# Patient Record
Sex: Female | Born: 1945 | Hispanic: No | Smoking: Never smoker
Health system: Southern US, Community
[De-identification: ages and names within clinical notes are randomized; demographics above are authoritative.]

## PROBLEM LIST (undated history)

## (undated) DIAGNOSIS — E119 Type 2 diabetes mellitus without complications: Secondary | ICD-10-CM

---

## 2006-03-18 DIAGNOSIS — R609 Edema, unspecified: Secondary | ICD-10-CM | POA: Insufficient documentation

## 2013-08-22 DIAGNOSIS — I2510 Atherosclerotic heart disease of native coronary artery without angina pectoris: Secondary | ICD-10-CM | POA: Insufficient documentation

## 2016-12-28 DIAGNOSIS — E1122 Type 2 diabetes mellitus with diabetic chronic kidney disease: Secondary | ICD-10-CM | POA: Insufficient documentation

## 2017-03-20 DIAGNOSIS — H524 Presbyopia: Secondary | ICD-10-CM | POA: Insufficient documentation

## 2017-11-29 DIAGNOSIS — I878 Other specified disorders of veins: Secondary | ICD-10-CM | POA: Insufficient documentation

## 2018-03-05 DIAGNOSIS — L03116 Cellulitis of left lower limb: Secondary | ICD-10-CM | POA: Insufficient documentation

## 2018-07-03 DIAGNOSIS — E103293 Type 1 diabetes mellitus with mild nonproliferative diabetic retinopathy without macular edema, bilateral: Secondary | ICD-10-CM | POA: Insufficient documentation

## 2019-07-02 ENCOUNTER — Ambulatory Visit: Payer: Medicare PPO | Admitting: Occupational Therapy

## 2019-07-07 ENCOUNTER — Encounter: Payer: Medicare PPO | Admitting: Occupational Therapy

## 2019-07-09 ENCOUNTER — Encounter: Payer: Medicare PPO | Admitting: Occupational Therapy

## 2019-07-14 ENCOUNTER — Encounter: Payer: Self-pay | Admitting: Emergency Medicine

## 2019-07-14 ENCOUNTER — Emergency Department: Payer: Medicare PPO

## 2019-07-14 ENCOUNTER — Telehealth: Payer: Self-pay | Admitting: Emergency Medicine

## 2019-07-14 ENCOUNTER — Other Ambulatory Visit: Payer: Self-pay

## 2019-07-14 ENCOUNTER — Inpatient Hospital Stay
Admission: EM | Admit: 2019-07-14 | Discharge: 2019-07-22 | DRG: 270 | Disposition: A | Discharge status: Skilled Nursing Facility | Payer: Medicare PPO | Attending: Internal Medicine | Admitting: Internal Medicine

## 2019-07-14 ENCOUNTER — Encounter: Payer: Medicare PPO | Admitting: Occupational Therapy

## 2019-07-14 DIAGNOSIS — I824Y2 Acute embolism and thrombosis of unspecified deep veins of left proximal lower extremity: Secondary | ICD-10-CM

## 2019-07-14 DIAGNOSIS — I129 Hypertensive chronic kidney disease with stage 1 through stage 4 chronic kidney disease, or unspecified chronic kidney disease: Secondary | ICD-10-CM | POA: Diagnosis present

## 2019-07-14 DIAGNOSIS — Z794 Long term (current) use of insulin: Secondary | ICD-10-CM

## 2019-07-14 DIAGNOSIS — Z933 Colostomy status: Secondary | ICD-10-CM

## 2019-07-14 DIAGNOSIS — E669 Obesity, unspecified: Secondary | ICD-10-CM | POA: Diagnosis present

## 2019-07-14 DIAGNOSIS — U071 COVID-19: Secondary | ICD-10-CM | POA: Diagnosis present

## 2019-07-14 DIAGNOSIS — I82431 Acute embolism and thrombosis of right popliteal vein: Secondary | ICD-10-CM | POA: Diagnosis present

## 2019-07-14 DIAGNOSIS — E10319 Type 1 diabetes mellitus with unspecified diabetic retinopathy without macular edema: Secondary | ICD-10-CM | POA: Diagnosis present

## 2019-07-14 DIAGNOSIS — M549 Dorsalgia, unspecified: Secondary | ICD-10-CM | POA: Diagnosis present

## 2019-07-14 DIAGNOSIS — I82461 Acute embolism and thrombosis of right calf muscular vein: Secondary | ICD-10-CM | POA: Diagnosis present

## 2019-07-14 DIAGNOSIS — Z6834 Body mass index (BMI) 34.0-34.9, adult: Secondary | ICD-10-CM

## 2019-07-14 DIAGNOSIS — Z932 Ileostomy status: Secondary | ICD-10-CM

## 2019-07-14 DIAGNOSIS — Z79899 Other long term (current) drug therapy: Secondary | ICD-10-CM

## 2019-07-14 DIAGNOSIS — E1022 Type 1 diabetes mellitus with diabetic chronic kidney disease: Secondary | ICD-10-CM | POA: Diagnosis present

## 2019-07-14 DIAGNOSIS — L98421 Non-pressure chronic ulcer of back limited to breakdown of skin: Secondary | ICD-10-CM | POA: Insufficient documentation

## 2019-07-14 DIAGNOSIS — E1042 Type 1 diabetes mellitus with diabetic polyneuropathy: Secondary | ICD-10-CM | POA: Diagnosis present

## 2019-07-14 DIAGNOSIS — Z833 Family history of diabetes mellitus: Secondary | ICD-10-CM

## 2019-07-14 DIAGNOSIS — D638 Anemia in other chronic diseases classified elsewhere: Secondary | ICD-10-CM | POA: Diagnosis present

## 2019-07-14 DIAGNOSIS — E875 Hyperkalemia: Secondary | ICD-10-CM | POA: Diagnosis not present

## 2019-07-14 DIAGNOSIS — I708 Atherosclerosis of other arteries: Secondary | ICD-10-CM | POA: Diagnosis present

## 2019-07-14 DIAGNOSIS — E1100 Type 2 diabetes mellitus with hyperosmolarity without nonketotic hyperglycemic-hyperosmolar coma (NKHHC): Secondary | ICD-10-CM | POA: Diagnosis not present

## 2019-07-14 DIAGNOSIS — I878 Other specified disorders of veins: Secondary | ICD-10-CM | POA: Diagnosis present

## 2019-07-14 DIAGNOSIS — N179 Acute kidney failure, unspecified: Secondary | ICD-10-CM | POA: Diagnosis present

## 2019-07-14 DIAGNOSIS — E860 Dehydration: Secondary | ICD-10-CM | POA: Diagnosis present

## 2019-07-14 DIAGNOSIS — L89320 Pressure ulcer of left buttock, unstageable: Secondary | ICD-10-CM | POA: Diagnosis present

## 2019-07-14 DIAGNOSIS — R531 Weakness: Secondary | ICD-10-CM

## 2019-07-14 DIAGNOSIS — H9190 Unspecified hearing loss, unspecified ear: Secondary | ICD-10-CM | POA: Diagnosis present

## 2019-07-14 DIAGNOSIS — Z85048 Personal history of other malignant neoplasm of rectum, rectosigmoid junction, and anus: Secondary | ICD-10-CM

## 2019-07-14 DIAGNOSIS — E8770 Fluid overload, unspecified: Secondary | ICD-10-CM | POA: Diagnosis not present

## 2019-07-14 DIAGNOSIS — Z8249 Family history of ischemic heart disease and other diseases of the circulatory system: Secondary | ICD-10-CM

## 2019-07-14 DIAGNOSIS — R739 Hyperglycemia, unspecified: Secondary | ICD-10-CM | POA: Diagnosis not present

## 2019-07-14 DIAGNOSIS — Z825 Family history of asthma and other chronic lower respiratory diseases: Secondary | ICD-10-CM

## 2019-07-14 DIAGNOSIS — I10 Essential (primary) hypertension: Secondary | ICD-10-CM | POA: Diagnosis present

## 2019-07-14 DIAGNOSIS — I82412 Acute embolism and thrombosis of left femoral vein: Secondary | ICD-10-CM | POA: Diagnosis present

## 2019-07-14 DIAGNOSIS — R627 Adult failure to thrive: Secondary | ICD-10-CM | POA: Diagnosis present

## 2019-07-14 DIAGNOSIS — E1165 Type 2 diabetes mellitus with hyperglycemia: Secondary | ICD-10-CM | POA: Diagnosis not present

## 2019-07-14 DIAGNOSIS — N184 Chronic kidney disease, stage 4 (severe): Secondary | ICD-10-CM | POA: Diagnosis present

## 2019-07-14 DIAGNOSIS — E876 Hypokalemia: Secondary | ICD-10-CM | POA: Diagnosis not present

## 2019-07-14 DIAGNOSIS — I82402 Acute embolism and thrombosis of unspecified deep veins of left lower extremity: Secondary | ICD-10-CM | POA: Diagnosis not present

## 2019-07-14 DIAGNOSIS — E1010 Type 1 diabetes mellitus with ketoacidosis without coma: Secondary | ICD-10-CM | POA: Diagnosis present

## 2019-07-14 DIAGNOSIS — L89150 Pressure ulcer of sacral region, unstageable: Secondary | ICD-10-CM | POA: Diagnosis present

## 2019-07-14 LAB — URINALYSIS, COMPLETE (UACMP) WITH MICROSCOPIC
Bacteria, UA: NONE SEEN
Bilirubin Urine: NEGATIVE
Glucose, UA: >=500 mg/dL — AB
Ketones, ur: NEGATIVE mg/dL
Nitrite: NEGATIVE
Protein, ur: NEGATIVE mg/dL
Specific Gravity, Urine: 1.012 (ref 1.005–1.030)
pH: 5 (ref 5.0–8.0)

## 2019-07-14 LAB — GLUCOSE, CAPILLARY
Glucose-Capillary: 274 mg/dL — ABNORMAL HIGH (ref 70–99)
Glucose-Capillary: 278 mg/dL — ABNORMAL HIGH (ref 70–99)
Glucose-Capillary: 311 mg/dL — ABNORMAL HIGH (ref 70–99)
Glucose-Capillary: 383 mg/dL — ABNORMAL HIGH (ref 70–99)
Glucose-Capillary: 555 mg/dL (ref 70–99)

## 2019-07-14 LAB — CBC WITH DIFFERENTIAL/PLATELET
Abs Immature Granulocytes: 0.19 10*3/uL — ABNORMAL HIGH (ref 0.00–0.07)
Basophils Absolute: 0.1 10*3/uL (ref 0.0–0.1)
Basophils Relative: 1 %
Eosinophils Absolute: 0 10*3/uL (ref 0.0–0.5)
Eosinophils Relative: 0 %
HCT: 44.6 % (ref 36.0–46.0)
Hemoglobin: 13.5 g/dL (ref 12.0–15.0)
Immature Granulocytes: 2 %
Lymphocytes Relative: 8 %
Lymphs Abs: 0.8 10*3/uL (ref 0.7–4.0)
MCH: 28.3 pg (ref 26.0–34.0)
MCHC: 30.3 g/dL (ref 30.0–36.0)
MCV: 93.5 fL (ref 80.0–100.0)
Monocytes Absolute: 0.6 10*3/uL (ref 0.1–1.0)
Monocytes Relative: 6 %
Neutro Abs: 8.1 10*3/uL — ABNORMAL HIGH (ref 1.7–7.7)
Neutrophils Relative %: 83 %
Platelets: 301 10*3/uL (ref 150–400)
RBC: 4.77 MIL/uL (ref 3.87–5.11)
RDW: 12.4 % (ref 11.5–15.5)
WBC: 9.7 10*3/uL (ref 4.0–10.5)
nRBC: 0 % (ref 0.0–0.2)

## 2019-07-14 LAB — PROTIME-INR
INR: 1.4 — ABNORMAL HIGH (ref 0.8–1.2)
Prothrombin Time: 17 seconds — ABNORMAL HIGH (ref 11.4–15.2)

## 2019-07-14 LAB — BLOOD GAS, VENOUS
Acid-base deficit: 12.4 mmol/L — ABNORMAL HIGH (ref 0.0–2.0)
Bicarbonate: 12.7 mmol/L — ABNORMAL LOW (ref 20.0–28.0)
O2 Saturation: 82.9 %
Patient temperature: 37
pCO2, Ven: 27 mmHg — ABNORMAL LOW (ref 44.0–60.0)
pH, Ven: 7.28 (ref 7.250–7.430)
pO2, Ven: 54 mmHg — ABNORMAL HIGH (ref 32.0–45.0)

## 2019-07-14 LAB — MRSA PCR SCREENING: MRSA by PCR: POSITIVE — AB

## 2019-07-14 LAB — COMPREHENSIVE METABOLIC PANEL
ALT: 13 U/L (ref 0–44)
AST: 20 U/L (ref 15–41)
Albumin: 2.8 g/dL — ABNORMAL LOW (ref 3.5–5.0)
Alkaline Phosphatase: 84 U/L (ref 38–126)
Anion gap: 18 — ABNORMAL HIGH (ref 5–15)
BUN: 95 mg/dL — ABNORMAL HIGH (ref 8–23)
CO2: 19 mmol/L — ABNORMAL LOW (ref 22–32)
Calcium: 8.9 mg/dL (ref 8.9–10.3)
Chloride: 92 mmol/L — ABNORMAL LOW (ref 98–111)
Creatinine, Ser: 3.54 mg/dL — ABNORMAL HIGH (ref 0.44–1.00)
GFR calc Af Amer: 14 mL/min — ABNORMAL LOW (ref >60)
GFR calc non Af Amer: 12 mL/min — ABNORMAL LOW (ref >60)
Glucose, Bld: 645 mg/dL (ref 70–99)
Potassium: 5 mmol/L (ref 3.5–5.1)
Sodium: 129 mmol/L — ABNORMAL LOW (ref 135–145)
Total Bilirubin: 1 mg/dL (ref 0.3–1.2)
Total Protein: 7.4 g/dL (ref 6.5–8.1)

## 2019-07-14 LAB — C-REACTIVE PROTEIN: CRP: 5.6 mg/dL — ABNORMAL HIGH (ref <1.0)

## 2019-07-14 LAB — TROPONIN I (HIGH SENSITIVITY)
Troponin I (High Sensitivity): 14 ng/L (ref <18)
Troponin I (High Sensitivity): 25 ng/L — ABNORMAL HIGH (ref <18)

## 2019-07-14 LAB — BASIC METABOLIC PANEL
Anion gap: 16 — ABNORMAL HIGH (ref 5–15)
BUN: 95 mg/dL — ABNORMAL HIGH (ref 8–23)
CO2: 21 mmol/L — ABNORMAL LOW (ref 22–32)
Calcium: 8.9 mg/dL (ref 8.9–10.3)
Chloride: 96 mmol/L — ABNORMAL LOW (ref 98–111)
Creatinine, Ser: 3.28 mg/dL — ABNORMAL HIGH (ref 0.44–1.00)
GFR calc Af Amer: 16 mL/min — ABNORMAL LOW (ref >60)
GFR calc non Af Amer: 13 mL/min — ABNORMAL LOW (ref >60)
Glucose, Bld: 351 mg/dL — ABNORMAL HIGH (ref 70–99)
Potassium: 3.9 mmol/L (ref 3.5–5.1)
Sodium: 133 mmol/L — ABNORMAL LOW (ref 135–145)

## 2019-07-14 LAB — APTT: aPTT: >160 seconds (ref 24–36)

## 2019-07-14 LAB — FERRITIN: Ferritin: 162 ng/mL (ref 11–307)

## 2019-07-14 LAB — FIBRIN DERIVATIVES D-DIMER (ARMC ONLY): Fibrin derivatives D-dimer (ARMC): >10000 ng/mL (FEU) — ABNORMAL HIGH (ref 0.00–499.00)

## 2019-07-14 LAB — BETA-HYDROXYBUTYRIC ACID: Beta-Hydroxybutyric Acid: 2.35 mmol/L — ABNORMAL HIGH (ref 0.05–0.27)

## 2019-07-14 LAB — PROCALCITONIN: Procalcitonin: 0.83 ng/mL

## 2019-07-14 MED ORDER — DEXTROSE-NACL 5-0.45 % IV SOLN: INTRAVENOUS | Status: DC

## 2019-07-14 MED ORDER — ONDANSETRON HCL 4 MG/2ML IJ SOLN: 4.0000 mg | Freq: Four times a day (QID) | INTRAMUSCULAR | Status: DC | PRN

## 2019-07-14 MED ORDER — ACETAMINOPHEN 650 MG RE SUPP: 650.0000 mg | Freq: Four times a day (QID) | RECTAL | Status: DC | PRN

## 2019-07-14 MED ORDER — MUPIROCIN 2 % EX OINT
1.0000 "application " | TOPICAL_OINTMENT | Freq: Two times a day (BID) | CUTANEOUS | Status: AC
  Administered 2019-07-14 – 2019-07-19 (×10): 1 via NASAL
  Filled 2019-07-14 (×2): qty 22

## 2019-07-14 MED ORDER — ADULT MULTIVITAMIN W/MINERALS CH
1.0000 | ORAL_TABLET | Freq: Every day | ORAL | Status: DC
  Administered 2019-07-15 – 2019-07-22 (×8): 1 via ORAL
  Filled 2019-07-14 (×8): qty 1

## 2019-07-14 MED ORDER — LACTATED RINGERS IV BOLUS
1000.0000 mL | Freq: Once | INTRAVENOUS | Status: AC
  Administered 2019-07-14: 1000 mL via INTRAVENOUS

## 2019-07-14 MED ORDER — CHLORHEXIDINE GLUCONATE CLOTH 2 % EX PADS
6.0000 | MEDICATED_PAD | Freq: Every day | CUTANEOUS | Status: AC
  Administered 2019-07-15 – 2019-07-19 (×3): 6 via TOPICAL

## 2019-07-14 MED ORDER — AMLODIPINE BESYLATE 5 MG PO TABS
2.5000 mg | ORAL_TABLET | Freq: Every day | ORAL | Status: DC
  Administered 2019-07-15 – 2019-07-21 (×7): 2.5 mg via ORAL
  Filled 2019-07-14 (×7): qty 1

## 2019-07-14 MED ORDER — INSULIN REGULAR(HUMAN) IN NACL 100-0.9 UT/100ML-% IV SOLN
INTRAVENOUS | Status: DC
  Administered 2019-07-15: 11 [IU]/h via INTRAVENOUS
  Filled 2019-07-14 (×2): qty 100

## 2019-07-14 MED ORDER — LACTATED RINGERS IV BOLUS
500.0000 mL | Freq: Once | INTRAVENOUS | Status: AC
  Administered 2019-07-14: 13:00:00 500 mL via INTRAVENOUS

## 2019-07-14 MED ORDER — ONDANSETRON HCL 4 MG PO TABS: 4.0000 mg | ORAL_TABLET | Freq: Four times a day (QID) | ORAL | Status: DC | PRN

## 2019-07-14 MED ORDER — PANTOPRAZOLE SODIUM 40 MG PO TBEC
40.0000 mg | DELAYED_RELEASE_TABLET | Freq: Every day | ORAL | Status: DC
  Administered 2019-07-15 – 2019-07-22 (×8): 40 mg via ORAL
  Filled 2019-07-14 (×8): qty 1

## 2019-07-14 MED ORDER — ATORVASTATIN CALCIUM 80 MG PO TABS
80.0000 mg | ORAL_TABLET | Freq: Every day | ORAL | Status: DC
  Administered 2019-07-15 – 2019-07-22 (×8): 80 mg via ORAL
  Filled 2019-07-14 (×4): qty 1
  Filled 2019-07-14 (×4): qty 4
  Filled 2019-07-14: qty 1
  Filled 2019-07-14: qty 4
  Filled 2019-07-14: qty 1
  Filled 2019-07-14: qty 4
  Filled 2019-07-14 (×2): qty 1
  Filled 2019-07-14: qty 4

## 2019-07-14 MED ORDER — HEPARIN (PORCINE) 25000 UT/250ML-% IV SOLN
950.0000 [IU]/h | INTRAVENOUS | Status: DC
  Administered 2019-07-14: 950 [IU]/h via INTRAVENOUS
  Filled 2019-07-14: qty 250

## 2019-07-14 MED ORDER — INSULIN ASPART 100 UNIT/ML ~~LOC~~ SOLN: 10.0000 [IU] | Freq: Once | SUBCUTANEOUS | Status: DC

## 2019-07-14 MED ORDER — VITAMIN D 25 MCG (1000 UNIT) PO TABS
5000.0000 [IU] | ORAL_TABLET | Freq: Every day | ORAL | Status: DC
  Administered 2019-07-15 – 2019-07-22 (×8): 5000 [IU] via ORAL
  Filled 2019-07-14 (×8): qty 5

## 2019-07-14 MED ORDER — SODIUM CHLORIDE 0.9 % IV SOLN: INTRAVENOUS | Status: DC

## 2019-07-14 MED ORDER — ACETAMINOPHEN 325 MG PO TABS
650.0000 mg | ORAL_TABLET | Freq: Four times a day (QID) | ORAL | Status: DC | PRN
  Administered 2019-07-18: 650 mg via ORAL
  Filled 2019-07-14: qty 2

## 2019-07-14 MED ORDER — INSULIN REGULAR(HUMAN) IN NACL 100-0.9 UT/100ML-% IV SOLN
INTRAVENOUS | Status: DC
  Administered 2019-07-14: 9.5 [IU]/h via INTRAVENOUS
  Filled 2019-07-14: qty 100

## 2019-07-14 MED ORDER — DEXTROSE 50 % IV SOLN
0.0000 mL | INTRAVENOUS | Status: DC | PRN
  Filled 2019-07-14: qty 50

## 2019-07-14 MED ORDER — VENLAFAXINE HCL ER 75 MG PO CP24
75.0000 mg | ORAL_CAPSULE | Freq: Every day | ORAL | Status: DC
  Administered 2019-07-15 – 2019-07-22 (×8): 75 mg via ORAL
  Filled 2019-07-14 (×8): qty 1

## 2019-07-14 MED ORDER — SODIUM CHLORIDE 0.9 % IV BOLUS: 1000.0000 mL | INTRAVENOUS | Status: AC

## 2019-07-14 MED ORDER — GABAPENTIN 300 MG PO CAPS
300.0000 mg | ORAL_CAPSULE | Freq: Three times a day (TID) | ORAL | Status: DC | PRN
  Administered 2019-07-17: 300 mg via ORAL
  Filled 2019-07-14: qty 1

## 2019-07-14 MED ORDER — HEPARIN BOLUS VIA INFUSION
4000.0000 [IU] | Freq: Once | INTRAVENOUS | Status: AC
  Administered 2019-07-14: 4000 [IU] via INTRAVENOUS
  Filled 2019-07-14: qty 4000

## 2019-07-14 MED ORDER — ASPIRIN EC 81 MG PO TBEC
81.0000 mg | DELAYED_RELEASE_TABLET | Freq: Every day | ORAL | Status: DC
  Administered 2019-07-15 – 2019-07-22 (×8): 81 mg via ORAL
  Filled 2019-07-14 (×8): qty 1

## 2019-07-14 MED ORDER — METOPROLOL SUCCINATE ER 25 MG PO TB24
25.0000 mg | ORAL_TABLET | Freq: Every day | ORAL | Status: DC
  Administered 2019-07-15 – 2019-07-22 (×8): 25 mg via ORAL
  Filled 2019-07-14 (×8): qty 1

## 2019-07-14 MED ORDER — SODIUM BICARBONATE 650 MG PO TABS
650.0000 mg | ORAL_TABLET | Freq: Two times a day (BID) | ORAL | Status: DC
  Administered 2019-07-14 – 2019-07-19 (×10): 650 mg via ORAL
  Filled 2019-07-14 (×12): qty 1

## 2019-07-14 NOTE — Consult Note (Signed)
ANTICOAGULATION CONSULT NOTE - Initial Consult  Pharmacy Consult for Heparin Drip Indication: VTE treatment  No Known Allergies  Patient Measurements: Height: 4\' 11"  (149.9 cm) Weight: 169 lb (76.7 kg) IBW/kg (Calculated) : 43.2 Heparin Dosing Weight: 60.8kg  Vital Signs: Temp: 97.7 F (36.5 C) (01/25 1213) Temp Source: Oral (01/25 1213) BP: 143/62 (01/25 1730) Pulse Rate: 106 (01/25 1730)  Labs: Recent Labs    07/14/19 1222 07/14/19 1529  HGB 13.5  --   HCT 44.6  --   PLT 301  --   CREATININE  --  3.54*  TROPONINIHS  --  14    Estimated Creatinine Clearance: 12.8 mL/min (A) (by C-G formula based on SCr of 3.54 mg/dL (H)).  Medications:  No pta anticoagulant of record  Assessment: Anne George is a 74 y.o. female with past medical history of diabetes, good fluid removal, colostomy, and recent history of COVID-19 infection presents to the ED complaining of generalized weakness.  Pharmacy has been consulted to initiate and monitor heparin drip for VTE treatment.   Goal of Therapy:  Heparin level 0.3-0.7 units/ml Monitor platelets by anticoagulation protocol: Yes   Plan:  Give 4000 units bolus x 1, followed by 950 units/hour.  Will check heparin level in 8 hours per protocol, and CBC's daily while on heparin drip.    Lu Duffel, PharmD, BCPS Clinical Pharmacist 07/14/2019 6:12 PM

## 2019-07-14 NOTE — ED Provider Notes (Signed)
Patient looks very weak with acute kidney injury and hyperglycemia.  Anion gap is elevated but I think this will improve just with fluids and I will give a dose of subcutaneous insulin.  I will also order a Doppler of the lower extremities.  I will discuss with the hospitalist for admission.   Earleen Newport, MD 07/14/19 [phone]

## 2019-07-14 NOTE — ED Triage Notes (Signed)
Pt presents from home via [facility] with c/o bilateral leg weakness that has progressively gotten worse over the last week. Pt covid positive on jan 13. Blood sugar 524, pt states she did not take insulin today. Pt given 254mL of fluid by ems in route. Pt currently alert and oriented x4.

## 2019-07-14 NOTE — H&P (Signed)
TRH H&P   Patient Demographics:    Anne George, is a 74 y.o. female  MRN: [id number]   DOB - 10/18/1945  Admit Date - 07/14/2019  Outpatient Primary MD for the patient is Hulen Skains, Edwinna Areola, MD  Referring MD ED  Outpatient Specialists: Outpatient cardiology and nephrology at [facility]  Patient coming from: Home  Chief Complaint  Patient presents with  . Weakness      HPI:    Anne George  is a 74 y.o. female, with history of type 1 diabetes mellitus on insulin with chronic kidney disease stage IV (baseline creatinine around 2.5), chronic lymphedema (being followed by outpatient wound care), hypertension, colostomy status, anemia of chronic disease, chronic venous stasis who was brought by EMS after patient called being unable to get out of bed and having difficulty ambulating for past few days.  She was seen by her PCP about 12 days back on 1/13 for fever and tested positive for COVID-19.  She reports at that time she had fever of 104 F and some dyspnea on exertion but no other symptoms.  She stayed at home and did not have any other symptoms and felt recovered.  However for the past 3-4 days she has been feeling increasingly weak having difficulty walking with pain in both her legs.  She also had very poor appetite and also was not hydrating herself well.  She also reports some polyuria.  She has been having some nonproductive cough for past 3 days as well.  She denies any shortness of breath, chills, fever, nausea, vomiting, chest pain, palpitations, abdominal pain, diarrhea, dysuria, fall, joint pains.  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  Denies any weight loss or sick contacts.  Patient reports taking Lantus 28 units at bedtime and sliding scale insulin premeal with every carb intake.  Given her poor appetite she was not taking premeal insulin.  Last night her blood glucose  was 194 but this morning it went up to 400.  EMS found that her home condition was very filthy and messy with several roaches.  In the ED she was tachycardic to low 100s, maintaining O2 sat on room air, normal blood pressure, respiratory rate and afebrile.  Blood work showed normal CBC.  Chemistry showed sodium of 129, chloride of 92, CO2 19 with a gap of 18, acute on chronic kidney injury with BUN of 95 and creatinine of 3.54, glucose of 645.  UA was negative for ketones.  Fibrin derivative D-dimer was >10,000.  Ferritin, procalcitonin and troponin were normal.  C-reactive protein pending. .  Patient given IV normal saline bolus, 10 units regular insulin and started on IV insulin drip.  Doppler lower extremity was done in the ED given her significantly high D-dimer with preliminary finding of occluded proximal popliteal and femoral veins.   Review of systems:    In addition  to the HPI above,  No Fever-chills, No Headache, No changes with Vision or hearing, No problems swallowing food or Liquids, No Chest pain, shortness of breath, cough + No Abdominal pain, No Nausea or Vommitting, Bowel movements are regular, No Blood in stool or Urine, No dysuria, polyuria + no polydipsia or polyphagia Generalized weakness with bilateral lower extremity pain, no tingling or numbness No new skin rashes or bruises, No new joints pains-aches,  No recent weight gain or loss, No significant Mental Stressors.   With Past History of the following :    Past medical history Hypertension Chronic kidney disease stage IV Diabetes mellitus type 1 with chronic kidney disease Chronic venous stasis Chronic lymphedema Obesity  Past surgical history Colostomy   Social History:     Social History   Tobacco Use  . Smoking status: Never Smoker  . Smokeless tobacco: Never Used  Substance Use Topics  . Alcohol use: Not on file     Lives -alone  Mobility -uses cane to ambulate at baseline      Family History :   Heart disease and diabetes in family   Home Medications:   Prior to Admission medications   Medication Sig Start Date End Date Taking? Authorizing Provider  amLODipine (NORVASC) 2.5 MG tablet Take 2.5 mg by mouth daily. 05/12/19 05/11/20 Yes [provider]  aspirin 81 MG EC tablet Take 81 mg by mouth daily. 02/02/16  Yes [provider]  atorvastatin (LIPITOR) 80 MG tablet Take 80 mg by mouth daily. 06/17/19  Yes [provider]  Cholecalciferol 125 MCG (5000 UT) TABS Take 5,000 Units by mouth daily. 08/16/16  Yes [provider]  furosemide (LASIX) 20 MG tablet Take 40 mg by mouth 2 (two) times daily. 06/18/19  Yes [provider]  gabapentin (NEURONTIN) 100 MG capsule Take 300 mg by mouth 3 (three) times daily as needed. 06/18/19  Yes [provider]  insulin aspart (NOVOLOG FLEXPEN) 100 UNIT/ML FlexPen INJECT BEFORE MEALS AND SNACKS 3 TO 5 TIMES DAILY AS NEEDED (2 UNITS FOR EVERY 15 GRAMS CARBS). PEN IN USE EXPIRES 28 DAYS 05/25/17  Yes [provider]  Insulin Glargine (LANTUS SOLOSTAR) 100 UNIT/ML Solostar Pen INJECT  28 UNITS SUBCUTANEOUSLY EVERY NIGHT 07/26/18  Yes [provider]  metoprolol succinate (TOPROL-XL) 25 MG 24 hr tablet Take 25 mg by mouth daily. 06/18/19  Yes [provider]  Multiple Vitamins-Minerals (COMPLETE) TABS Take 1 tablet by mouth daily. 07/19/07  Yes [provider]  omeprazole (PRILOSEC) 20 MG capsule Take 20 mg by mouth daily. 06/18/19  Yes [provider]  sodium bicarbonate 650 MG tablet Take 650 mg by mouth 2 (two) times daily. 04/24/19  Yes [provider]  venlafaxine XR (EFFEXOR-XR) 37.5 MG 24 hr capsule Take 2 capsules by mouth daily. 06/18/19  Yes [provider]     Allergies:    No Known Allergies   Physical Exam:   Vitals  Blood pressure (!) 129/53, pulse (!) 101, temperature 97.7 F (36.5 C), temperature source  Oral, resp. rate 16, height 4\' 11"  (1.499 m), weight 76.7 kg, SpO2 99 %.  Elderly obese female lying in bed appears fatigued, not in acute distress HEENT: Pupils reactive bilaterally, EOMI, no pallor, no icterus, dry oral mucosa, supple neck, no cervical lymphadenopathy Chest: Clear to auscultation bilateral, no added sounds CVS: Normal S1-S2, no murmurs rub or gallop GI: Soft, nondistended, nontender, colostomy with stool output, nonbloody, bowel sounds present Musculoskeletal: Bilateral lower  extreme lymphedema, tender to pressure over bilateral calves CNS: Alert and oriented, nonfocal   Data Review:    CBC Recent Labs  Lab 07/14/19 1222  WBC 9.7  HGB 13.5  HCT 44.6  PLT 301  MCV 93.5  MCH 28.3  MCHC 30.3  RDW 12.4  LYMPHSABS 0.8  MONOABS 0.6  EOSABS 0.0  BASOSABS 0.1   ------------------------------------------------------------------------------------------------------------------  Chemistries  Recent Labs  Lab 07/14/19 1529  NA 129*  K 5.0  CL 92*  CO2 19*  GLUCOSE 645*  BUN 95*  CREATININE 3.54*  CALCIUM 8.9  AST 20  ALT 13  ALKPHOS 84  BILITOT 1.0   ------------------------------------------------------------------------------------------------------------------ estimated creatinine clearance is 12.8 mL/min (A) (by C-G formula based on SCr of 3.54 mg/dL (H)). ------------------------------------------------------------------------------------------------------------------ No results for input(s): TSH, T4TOTAL, T3FREE, THYROIDAB in the last 72 hours.  Invalid input(s): FREET3  Coagulation profile No results for input(s): INR, PROTIME in the last 168 hours. ------------------------------------------------------------------------------------------------------------------- No results for input(s): DDIMER in the last 72 hours. -------------------------------------------------------------------------------------------------------------------  Cardiac  Enzymes No results for input(s): CKMB, TROPONINI, MYOGLOBIN in the last 168 hours.  Invalid input(s): CK ------------------------------------------------------------------------------------------------------------------ No results found for: BNP   ---------------------------------------------------------------------------------------------------------------  Urinalysis    Component Value Date/Time   COLORURINE YELLOW (A) 07/14/2019 1246   APPEARANCEUR HAZY (A) 07/14/2019 1246   LABSPEC 1.012 07/14/2019 1246   PHURINE 5.0 07/14/2019 1246   GLUCOSEU >=500 (A) 07/14/2019 1246   HGBUR MODERATE (A) 07/14/2019 1246   BILIRUBINUR NEGATIVE 07/14/2019 [place] 07/14/2019 1246   PROTEINUR NEGATIVE 07/14/2019 1246   NITRITE NEGATIVE 07/14/2019 1246   LEUKOCYTESUR TRACE (A) 07/14/2019 1246    ----------------------------------------------------------------------------------------------------------------   Imaging Results:    DG Chest Portable 1 View  Result Date: 07/14/2019 CLINICAL DATA:  Recent COVID-19 positive.  Hyperglycemia. EXAM: PORTABLE CHEST 1 VIEW COMPARISON:  None. FINDINGS: Lungs are clear. Heart size and pulmonary vascularity are normal. No adenopathy. There is aortic atherosclerosis. No bone lesions. IMPRESSION: Lungs clear. Cardiac silhouette normal. No adenopathy. Aortic Atherosclerosis (ICD10-I70.0). Electronically Signed   By: Lowella Grip III M.D.   On: 07/14/2019 13:17    My personal review of [id number]   Assessment & Plan:   Principal problem   Hyperosmolar non-ketotic state in patient with type 2 diabetes mellitus ([place]) Received 1.5 L Ringer's lactate bolus in the ED.  Have ordered another 1 L normal saline bolus followed by maintenance normal saline at 125 cc/h.  Start on insulin drip.  Monitor CBG every 4 hours until anion gap closed.  Check A1c. Likely triggered by recent illness, poor p.o. intake and missing her premeal insulin  dose. Diabetic coordinator consulted.      DVT, lower extremity, proximal, acute, left ([place]) Started on IV heparin drip.  Will follow on final Doppler results.  Could have been prothrombotic complication of recent [id number] infection.  No respiratory symptoms at present.  I will consider VQ scan if has significant clot burden or respiratory symptoms.    COVID-19 virus infection No current symptoms.  Did not require hospitalization or treatment with steroids or remdesivir.  Monitor on airborne and contact precautions.  Follow inflammatory markers    Acute renal failure superimpsed on stage 4 chronic kidney disease (HCC) Suspect prerenal with dehydration.  Strict I's/O.  Monitor with IV hydration.  Hold Lasix. Follows with outpatient nephrology.  Avoid nephrotoxins    Essential hypertension Continue amlodipine and metoprolol.    FTT (failure to thrive) in adult PT evaluation  Obesity (BMI 30.0-34.9)  Diabetes mellitus with peripheral neuropathy Continue Neurontin  Social issues TOC consult for addressing home conditions.   DVT Prophylaxis IV heparin  AM Labs Ordered, also please review Full Orders  Family Communication: Admission, patients condition and plan of care including tests being ordered have been discussed with the patient at bedside  Code Status full code  Likely DC to home versus SNF depending upon hospital course  Condition GUARDED   Consults called none  Admission status: Inpatient  Patient presenting with hyperosmolar nonketotic state with severe dehydration, acute on chronic kidney disease stage IV and acute DVT of left lower extremity.  Patient needs to be monitored in an inpatient setting in a stepdown unit for aggressive IV hydration, IV insulin, close monitoring of her kidney function which is likely to worsen further and IV anticoagulation for her acute DVT.  She is at high risk of developing acute PE and further hemodynamic compromise.  For this she  needs to be monitored in an inpatient setting for at least >2 midnight.  Time spent in minutes : 60   Kristoffer Bala M.D on 07/14/2019 at 5:38 PM  Between 7am to 7pm - Pager - [phone]. After 7pm go to [email or web address] - password [facility]  [facility] - Office  [phone]

## 2019-07-14 NOTE — ED Provider Notes (Signed)
[facility] Emergency Department Provider Note   ____________________________________________   First MD Initiated Contact with Patient 07/14/19 1205     (approximate)  I have reviewed the triage vital signs and the nursing notes.   HISTORY  Chief Complaint Weakness    HPI [place] is a 74 y.o. female with past medical history of diabetes, good fluid removal, and colostomy presents to the ED complaining of generalized weakness.  Patient reports that she initially had a fever and cough about 2 weeks ago and subsequently tested positive for COVID-19.  She states the fevers seem to have resolved and cough is improved with no shortness of breath.  However, she complains of significant generalized weakness.  She lives at home alone and has been unable to walk over the past couple of days, per EMS was found sitting on the couch and had not moved from that location in 2 days.  She denies any chest pain, shortness of breath, vomiting, abdominal pain, or diarrhea.  She does state that her appetite has been poor, but she has been trying to stay hydrated with fluids.  She denies any dysuria or hematuria.        History reviewed. No pertinent past medical history.  There are no problems to display for this patient.   History reviewed. No pertinent surgical history.  Prior to Admission medications   Not on File    Allergies Patient has no known allergies.  History reviewed. No pertinent family history.  Social History Social History   Tobacco Use  . Smoking status: Never Smoker  . Smokeless tobacco: Never Used  Substance Use Topics  . Alcohol use: Not on file  . Drug use: Not on file    Review of Systems  Constitutional: No fever/chills.  Positive for generalized weakness. Eyes: No visual changes. ENT: No sore throat. Cardiovascular: Denies chest pain. Respiratory: Denies shortness of breath. Gastrointestinal: No abdominal pain.  No  nausea, no vomiting.  No diarrhea.  No constipation. Genitourinary: Negative for dysuria. Musculoskeletal: Negative for back pain. Skin: Negative for rash. Neurological: Negative for headaches, focal weakness or numbness.  ____________________________________________   PHYSICAL EXAM:  VITAL SIGNS: ED Triage Vitals  Enc Vitals Group     BP      Pulse      Resp      Temp      Temp src      SpO2      Weight      Height      Head Circumference      Peak Flow      Pain Score      Pain Loc      Pain Edu?      Excl. in [place]?     Constitutional: Alert and oriented. Eyes: Conjunctivae are normal. Head: Atraumatic. Nose: No congestion/rhinnorhea. Mouth/Throat: Mucous membranes are moist. Neck: Normal ROM Cardiovascular: Normal rate, regular rhythm. Grossly normal heart sounds. Respiratory: Normal respiratory effort.  No retractions. Lungs CTAB. Gastrointestinal: Soft and nontender. No distention.  Colostomy bag in place. Genitourinary: deferred Musculoskeletal: No lower extremity tenderness, lymphedema noted to bilateral lower extremities. Neurologic:  Normal speech and language. No gross focal neurologic deficits are appreciated. Skin: Sacral decubitus ulceration noted. Psychiatric: Mood and affect are normal. Speech and behavior are normal.  ____________________________________________   LABS (all labs ordered are listed, but only abnormal results are displayed)  Labs Reviewed  CBC WITH DIFFERENTIAL/PLATELET - Abnormal; Notable for the following components:  Result Value   Neutro Abs 8.1 (*)    Abs Immature Granulocytes 0.19 (*)    All other components within normal limits  URINALYSIS, COMPLETE (UACMP) WITH MICROSCOPIC - Abnormal; Notable for the following components:   Color, Urine YELLOW (*)    APPearance HAZY (*)    Glucose, UA >=500 (*)    Hgb urine dipstick MODERATE (*)    Leukocytes,Ua TRACE (*)    All other components within normal limits  C-REACTIVE  PROTEIN  COMPREHENSIVE METABOLIC PANEL  FIBRIN DERIVATIVES D-DIMER ([facility] ONLY)  FERRITIN  PROCALCITONIN  TROPONIN I (HIGH SENSITIVITY)  TROPONIN I (HIGH SENSITIVITY)   ____________________________________________  EKG  ED ECG REPORT I, Blake Divine, the attending physician, personally viewed and interpreted this ECG.   Date: 07/14/2019  EKG Time: 12:19  Rate: 96  Rhythm: normal sinus rhythm  Axis: Normal  Intervals:none  ST&T Change: None   PROCEDURES  Procedure(s) performed (including Critical Care):  Procedures   ____________________________________________   INITIAL IMPRESSION / ASSESSMENT AND PLAN / ED COURSE       74 year old female presents to the ED with increasing weakness following recent COVID-19 diagnosis where she tested +12 days ago.  She is not in any respiratory distress and is maintaining O2 sats on room air.  However, it does appear that she is unable to care for herself with this weakness secondary to COVID-19.  Lab work shows hemoconcentration consistent with dehydration, patient hydrated with 500 cc IV fluid prior to arrival and will give an additional 500 cc IV fluid here in the ED.  She was noted to have a sacral decubitus ulcer but this does not appear acutely infected.  Will screen labs and chest x-ray, anticipate admission.      ____________________________________________   FINAL CLINICAL IMPRESSION(S) / ED DIAGNOSES  Final diagnoses:  [id number] virus infection  Generalized weakness  Skin ulcer of sacrum, limited to breakdown of skin [facility])     ED Discharge Orders    None       Note:  This document was prepared using Dragon voice recognition software and may include unintentional dictation errors.   Blake Divine, MD 07/14/19 [phone]

## 2019-07-15 DIAGNOSIS — I82402 Acute embolism and thrombosis of unspecified deep veins of left lower extremity: Secondary | ICD-10-CM

## 2019-07-15 DIAGNOSIS — E876 Hypokalemia: Secondary | ICD-10-CM

## 2019-07-15 DIAGNOSIS — E1165 Type 2 diabetes mellitus with hyperglycemia: Secondary | ICD-10-CM

## 2019-07-15 DIAGNOSIS — Z794 Long term (current) use of insulin: Secondary | ICD-10-CM

## 2019-07-15 LAB — BASIC METABOLIC PANEL
Anion gap: 13 (ref 5–15)
Anion gap: 11 (ref 5–15)
BUN: 92 mg/dL — ABNORMAL HIGH (ref 8–23)
BUN: 88 mg/dL — ABNORMAL HIGH (ref 8–23)
CO2: 23 mmol/L (ref 22–32)
CO2: 25 mmol/L (ref 22–32)
Calcium: 8.9 mg/dL (ref 8.9–10.3)
Calcium: 8.7 mg/dL — ABNORMAL LOW (ref 8.9–10.3)
Chloride: 99 mmol/L (ref 98–111)
Chloride: 97 mmol/L — ABNORMAL LOW (ref 98–111)
Creatinine, Ser: 2.91 mg/dL — ABNORMAL HIGH (ref 0.44–1.00)
Creatinine, Ser: 2.86 mg/dL — ABNORMAL HIGH (ref 0.44–1.00)
GFR calc Af Amer: 18 mL/min — ABNORMAL LOW (ref >60)
GFR calc Af Amer: 18 mL/min — ABNORMAL LOW (ref >60)
GFR calc non Af Amer: 15 mL/min — ABNORMAL LOW (ref >60)
GFR calc non Af Amer: 16 mL/min — ABNORMAL LOW (ref >60)
Glucose, Bld: 112 mg/dL — ABNORMAL HIGH (ref 70–99)
Glucose, Bld: 185 mg/dL — ABNORMAL HIGH (ref 70–99)
Potassium: 3.8 mmol/L (ref 3.5–5.1)
Potassium: 3.1 mmol/L — ABNORMAL LOW (ref 3.5–5.1)
Sodium: 135 mmol/L (ref 135–145)
Sodium: 133 mmol/L — ABNORMAL LOW (ref 135–145)

## 2019-07-15 LAB — HEMOGLOBIN A1C
Hgb A1c MFr Bld: 8.4 % — ABNORMAL HIGH (ref 4.8–5.6)
Mean Plasma Glucose: 194.38 mg/dL

## 2019-07-15 LAB — GLUCOSE, CAPILLARY
Glucose-Capillary: 117 mg/dL — ABNORMAL HIGH (ref 70–99)
Glucose-Capillary: 119 mg/dL — ABNORMAL HIGH (ref 70–99)
Glucose-Capillary: 129 mg/dL — ABNORMAL HIGH (ref 70–99)
Glucose-Capillary: 148 mg/dL — ABNORMAL HIGH (ref 70–99)
Glucose-Capillary: 209 mg/dL — ABNORMAL HIGH (ref 70–99)
Glucose-Capillary: 230 mg/dL — ABNORMAL HIGH (ref 70–99)
Glucose-Capillary: 268 mg/dL — ABNORMAL HIGH (ref 70–99)
Glucose-Capillary: 43 mg/dL — CL (ref 70–99)
Glucose-Capillary: 45 mg/dL — ABNORMAL LOW (ref 70–99)
Glucose-Capillary: 575 mg/dL (ref 70–99)
Glucose-Capillary: 94 mg/dL (ref 70–99)

## 2019-07-15 LAB — FIBRIN DERIVATIVES D-DIMER (ARMC ONLY): Fibrin derivatives D-dimer (ARMC): >7500 ng/mL (FEU) — ABNORMAL HIGH (ref 0.00–499.00)

## 2019-07-15 LAB — CBC
HCT: 38.9 % (ref 36.0–46.0)
Hemoglobin: 12.7 g/dL (ref 12.0–15.0)
MCH: 28.4 pg (ref 26.0–34.0)
MCHC: 32.6 g/dL (ref 30.0–36.0)
MCV: 87 fL (ref 80.0–100.0)
Platelets: 320 10*3/uL (ref 150–400)
RBC: 4.47 MIL/uL (ref 3.87–5.11)
RDW: 12.2 % (ref 11.5–15.5)
WBC: 14.7 10*3/uL — ABNORMAL HIGH (ref 4.0–10.5)
nRBC: 0 % (ref 0.0–0.2)

## 2019-07-15 LAB — OSMOLALITY: Osmolality: 320 mOsm/kg — ABNORMAL HIGH (ref 275–295)

## 2019-07-15 LAB — HEPARIN LEVEL (UNFRACTIONATED)
Heparin Unfractionated: 2.68 IU/mL — ABNORMAL HIGH (ref 0.30–0.70)
Heparin Unfractionated: 1.21 IU/mL — ABNORMAL HIGH (ref 0.30–0.70)
Heparin Unfractionated: <0.1 IU/mL — ABNORMAL LOW (ref 0.30–0.70)

## 2019-07-15 LAB — C-REACTIVE PROTEIN: CRP: 6.3 mg/dL — ABNORMAL HIGH (ref <1.0)

## 2019-07-15 MED ORDER — INSULIN ASPART 100 UNIT/ML ~~LOC~~ SOLN
0.0000 [IU] | Freq: Every day | SUBCUTANEOUS | Status: DC
  Administered 2019-07-16: 2 [IU] via SUBCUTANEOUS
  Administered 2019-07-17: 22:00:00 5 [IU] via SUBCUTANEOUS
  Administered 2019-07-20: 22:00:00 3 [IU] via SUBCUTANEOUS
  Filled 2019-07-15 (×5): qty 1

## 2019-07-15 MED ORDER — INSULIN ASPART 100 UNIT/ML ~~LOC~~ SOLN
5.0000 [IU] | Freq: Three times a day (TID) | SUBCUTANEOUS | Status: DC
  Administered 2019-07-16 – 2019-07-21 (×15): 5 [IU] via SUBCUTANEOUS
  Filled 2019-07-15 (×15): qty 1

## 2019-07-15 MED ORDER — INSULIN GLARGINE 100 UNIT/ML ~~LOC~~ SOLN
20.0000 [IU] | SUBCUTANEOUS | Status: DC
  Administered 2019-07-15: 20 [IU] via SUBCUTANEOUS
  Filled 2019-07-15: qty 0.2

## 2019-07-15 MED ORDER — INSULIN ASPART 100 UNIT/ML ~~LOC~~ SOLN: 3.0000 [IU] | Freq: Three times a day (TID) | SUBCUTANEOUS | Status: DC

## 2019-07-15 MED ORDER — SODIUM CHLORIDE 0.9 % IV SOLN
INTRAVENOUS | Status: DC
  Administered 2019-07-15: 100 mL/h via INTRAVENOUS

## 2019-07-15 MED ORDER — INSULIN DETEMIR 100 UNIT/ML ~~LOC~~ SOLN
22.0000 [IU] | Freq: Every day | SUBCUTANEOUS | Status: DC
  Administered 2019-07-16 – 2019-07-21 (×6): 22 [IU] via SUBCUTANEOUS
  Filled 2019-07-15: qty 0.22
  Filled 2019-07-15 (×6): qty 1

## 2019-07-15 MED ORDER — INSULIN ASPART 100 UNIT/ML ~~LOC~~ SOLN
0.0000 [IU] | Freq: Three times a day (TID) | SUBCUTANEOUS | Status: DC
  Administered 2019-07-15: 1 [IU] via SUBCUTANEOUS
  Administered 2019-07-16: 7 [IU] via SUBCUTANEOUS
  Administered 2019-07-16 (×2): 3 [IU] via SUBCUTANEOUS
  Administered 2019-07-17: 1 [IU] via SUBCUTANEOUS
  Administered 2019-07-17: 5 [IU] via SUBCUTANEOUS
  Administered 2019-07-17: 09:00:00 1 [IU] via SUBCUTANEOUS
  Administered 2019-07-18 (×2): 2 [IU] via SUBCUTANEOUS
  Administered 2019-07-18: 5 [IU] via SUBCUTANEOUS
  Administered 2019-07-19: 17:00:00 2 [IU] via SUBCUTANEOUS
  Administered 2019-07-19: 1 [IU] via SUBCUTANEOUS
  Administered 2019-07-19: 13:00:00 7 [IU] via SUBCUTANEOUS
  Administered 2019-07-20: 5 [IU] via SUBCUTANEOUS
  Administered 2019-07-20: 3 [IU] via SUBCUTANEOUS
  Administered 2019-07-20: 12:00:00 7 [IU] via SUBCUTANEOUS
  Administered 2019-07-21: 09:00:00 2 [IU] via SUBCUTANEOUS
  Administered 2019-07-21: 9 [IU] via SUBCUTANEOUS
  Filled 2019-07-15 (×18): qty 1

## 2019-07-15 MED ORDER — HEPARIN BOLUS VIA INFUSION
2000.0000 [IU] | Freq: Once | INTRAVENOUS | Status: AC
  Administered 2019-07-15: 2000 [IU] via INTRAVENOUS
  Filled 2019-07-15: qty 2000

## 2019-07-15 MED ORDER — HEPARIN (PORCINE) 25000 UT/250ML-% IV SOLN
750.0000 [IU]/h | INTRAVENOUS | Status: DC
  Administered 2019-07-15: 600 [IU]/h via INTRAVENOUS
  Filled 2019-07-15: qty 250

## 2019-07-15 MED ORDER — POTASSIUM CHLORIDE CRYS ER 20 MEQ PO TBCR
40.0000 meq | EXTENDED_RELEASE_TABLET | Freq: Once | ORAL | Status: AC
  Administered 2019-07-15: 11:00:00 40 meq via ORAL
  Filled 2019-07-15: qty 2

## 2019-07-15 MED ORDER — INSULIN GLARGINE 100 UNIT/ML ~~LOC~~ SOLN
22.0000 [IU] | SUBCUTANEOUS | Status: DC
  Filled 2019-07-15: qty 0.22

## 2019-07-15 MED ORDER — DEXTROSE 50 % IV SOLN
1.0000 | Freq: Once | INTRAVENOUS | Status: AC
  Administered 2019-07-15: 50 mL via INTRAVENOUS

## 2019-07-15 NOTE — Consult Note (Signed)
ANTICOAGULATION CONSULT NOTE - Initial Consult  Pharmacy Consult for Heparin Drip Indication: VTE treatment  No Known Allergies  Patient Measurements: Height: 4\' 11"  (149.9 cm) Weight: 169 lb (76.7 kg) IBW/kg (Calculated) : 43.2 Heparin Dosing Weight: 60.8kg  Vital Signs: Temp: 97.7 F (36.5 C) (01/26 1600) BP: 115/38 (01/26 1600) Pulse Rate: 79 (01/26 1600)  Labs: Recent Labs    07/14/19 1222 07/14/19 1529 07/14/19 1529 07/14/19 2133 07/15/19 0426 07/15/19 1004 07/15/19 1809  HGB 13.5  --   --   --  12.7  --   --   HCT 44.6  --   --   --  38.9  --   --   PLT 301  --   --   --  320  --   --   APTT  --   --   --  >160*  --   --   --   LABPROT  --   --   --  17.0*  --   --   --   INR  --   --   --  1.4*  --   --   --   HEPARINUNFRC  --   --   --   --  2.68* 1.21* <0.10*  CREATININE  --  3.54*   < > 3.28* 2.91* 2.86*  --   TROPONINIHS  --  14  --  25*  --   --   --    < > = values in this interval not displayed.    Estimated Creatinine Clearance: 15.9 mL/min (A) (by C-G formula based on SCr of 2.86 mg/dL (H)).  Medications:  No pta anticoagulant of record  Assessment: Anne George is a 74 y.o. female with past medical history of diabetes, good fluid removal, colostomy, and recent history of COVID-19 infection presents to the ED complaining of generalized weakness.  Pharmacy has been consulted to initiate and monitor heparin drip for VTE treatment.  1/26 0426 HL 2.68 Held for 2 hours and restart at a rate of 600 units/hr  1/26 1004 HL 1.21 No change  1/25 1809 HL < 0.1    Goal of Therapy:  Heparin level 0.3-0.7 units/ml Monitor platelets by anticoagulation protocol: Yes   Plan:  Heparin level subtherapeutic. Will give a 2000 units bolus and increae heparin gtt to 750 units/hr. Per RN heparin has not stopped and running fine. She changed out the lines to be safe. Recheck heparin level in 8 hours. CBC with AM labs.   Oswald Hillock, PharmD, BCPS Clinical  Pharmacist 07/15/2019 7:15 PM

## 2019-07-15 NOTE — Progress Notes (Signed)
Report called to Butch Penny on 1C.

## 2019-07-15 NOTE — Progress Notes (Signed)
PROGRESS NOTE                                                                                                                                                                                                             Patient Demographics:    Anne George, is a 74 y.o. female, DOB - Nov 26, 1945, [id number]  Admit date - 07/14/2019   Admitting Physician Louellen Molder, MD  Outpatient Primary MD for the patient is Hulen Skains, Edwinna Areola, MD  LOS - 1  Outpatient Specialists:  Chief Complaint  Patient presents with  . Weakness       Brief Narrative   74 year old female with type 1 diabetes mellitus, uncontrolled, chronic kidney disease stage IV (baseline creatinine around 2.5, follows with outpatient nephrology), chronic lymphedema/chronic venous stasis (being followed by outpatient wound care), hypertension, colostomy status, anemia of chronic disease, presented after having difficulty ambulating and bilateral leg pain.  She was tested positive for COVID-19 on 1/13 with associated fever (100 F) and some dyspnea but did not require hospitalization or any other treatment. Patient now having nonproductive cough for 3 days with significant weakness and difficulty ambulating. Patient did not take her premeal aspart as she was not eating well for past 3 days.  She was brought in by EMS who also noticed that she has very poor living conditions.  In the ED she was in hyperosmolar nonketotic state and blood work showed acute on chronic kidney disease and markedly elevated D-dimer (>10K).  Doppler lower extremities showing diffuse occlusion of left lower leg veins.   Subjective:   Patient reports some pain in her left leg.  Anion gap closed and off insulin drip.   Assessment  & Plan :   Principal problem   Hyperosmolar non-ketotic state in patient with type 2 diabetes mellitus (HCC) Anion gap closed and off insulin drip and IV  fluids.  Placed on Lantus 22 units at bedtime and premeal aspart. A1c of 8.4.  Diabetic coordinator following.  Active problems   DVT, lower extremity, proximal, acute, left (HCC) Occlusive DVT throughout left lower extremity (common femoral, profundus femoral, femoral, popliteal and visualized calf veins). Likely contributed by recent poor mobility and?  Prothrombotic event following COVID-19 infection. On IV heparin.  Vascular surgery consult appreciated and plans on left leg thrombectomy on 1/29, once renal function further improves.  Acute renal failure superimposed on stage 4 chronic kidney disease (HCC) Possibly prerenal with dehydration and poor p.o. intake.  Slowly improving in a.m. lab.  Monitor with IV fluids.  Holding Lasix.  Avoid nephrotoxins.  Hypokalemia Replenished    COVID-19 virus infection, recent No respiratory symptoms.  (Diagnosed on 1/13).  However does have elevated fibrin derivative D-dimer and CRP.  Monitor for now.     Essential hypertension Stable on amlodipine and metoprolol.    FTT (failure to thrive) in adult PT once able  Diabetic peripheral neuropathy Continue Neurontin    Obesity (BMI 30.0-34.9)  Social issues/poor home living condition [facility] consult   Stable for transfer to [place].  Code Status : Full code  Family Communication  : None (reports no close family)  Disposition Plan  : Pending vascular procedure on 1/29  Barriers For Discharge : Active symptoms, left lower leg DVT, uncontrolled diabetes  Consults  : Vascular surgery  Procedures  : Doppler lower extremity  DVT Prophylaxis  : IV heparin  Lab Results  Component Value Date   PLT 320 07/15/2019    Antibiotics  :    Anti-infectives (From admission, onward)   None        Objective:   Vitals:   07/15/19 0900 07/15/19 0912 07/15/19 1000 07/15/19 1100  BP: 135/77 (!) 146/78 (!) 134/56 (!) 123/53  Pulse: 90 90 82 77  Resp: 18  15 18   Temp:      TempSrc:       SpO2: 99%  95% 98%  Weight:      Height:        Wt Readings from Last 3 Encounters:  07/14/19 76.7 kg     Intake/Output Summary (Last 24 hours) at 07/15/2019 1204 Last data filed at 07/15/2019 0600 Gross per 24 hour  Intake 532.83 ml  Output 150 ml  Net 382.83 ml     Physical Exam  Gen: not in distress HEENT: , moist mucosa, supple neck Chest: clear b/l, no added sounds CVS: N S1&S2, no murmurs, GI: soft, nondistended, nontender, colostomy with clear output Musculoskeletal: warm, chronic left leg lymphedema, tender to pressure over bilateral calf and thighS   Data Review:    CBC Recent Labs  Lab 07/14/19 1222 07/15/19 0426  WBC 9.7 14.7*  HGB 13.5 12.7  HCT 44.6 38.9  PLT 301 320  MCV 93.5 87.0  MCH 28.3 28.4  MCHC 30.3 32.6  RDW 12.4 12.2  LYMPHSABS 0.8  --   MONOABS 0.6  --   EOSABS 0.0  --   BASOSABS 0.1  --     Chemistries  Recent Labs  Lab 07/14/19 1529 07/14/19 2133 07/15/19 0426 07/15/19 1004  NA 129* 133* 135 133*  K 5.0 3.9 3.8 3.1*  CL 92* 96* 99 97*  CO2 19* 21* 23 25  GLUCOSE 645* 351* 112* 185*  BUN 95* 95* 92* 88*  CREATININE 3.54* 3.28* 2.91* 2.86*  CALCIUM 8.9 8.9 8.9 8.7*  AST 20  --   --   --   ALT 13  --   --   --   ALKPHOS 84  --   --   --   BILITOT 1.0  --   --   --    ------------------------------------------------------------------------------------------------------------------ No results for input(s): CHOL, HDL, LDLCALC, TRIG, CHOLHDL, LDLDIRECT in the last 72 hours.  Lab Results  Component Value Date   HGBA1C 8.4 (H) 07/14/2019   ------------------------------------------------------------------------------------------------------------------ No results for input(s): TSH, T4TOTAL, T3FREE,  THYROIDAB in the last 72 hours.  Invalid input(s): FREET3 ------------------------------------------------------------------------------------------------------------------ Recent Labs    07/14/19 1529  FERRITIN 162     Coagulation profile Recent Labs  Lab 07/14/19 2133  INR 1.4*    No results for input(s): DDIMER in the last 72 hours.  Cardiac Enzymes No results for input(s): CKMB, TROPONINI, MYOGLOBIN in the last 168 hours.  Invalid input(s): CK ------------------------------------------------------------------------------------------------------------------ No results found for: BNP  Inpatient Medications  Scheduled Meds: . amLODipine  2.5 mg Oral Daily  . aspirin EC  81 mg Oral Daily  . atorvastatin  80 mg Oral Daily  . Chlorhexidine Gluconate Cloth  6 each Topical Q0600  . cholecalciferol  5,000 Units Oral Daily  . insulin aspart  0-5 Units Subcutaneous QHS  . insulin aspart  0-9 Units Subcutaneous TID WC  . insulin aspart  3 Units Subcutaneous TID WC  . insulin glargine  20 Units Subcutaneous Q24H  . metoprolol succinate  25 mg Oral Daily  . multivitamin with minerals  1 tablet Oral Daily  . mupirocin ointment  1 application Nasal BID  . pantoprazole  40 mg Oral Daily  . potassium chloride  40 mEq Oral Once  . sodium bicarbonate  650 mg Oral BID  . venlafaxine XR  75 mg Oral Daily   Continuous Infusions: . sodium chloride Stopped (07/15/19 0156)  . sodium chloride 75 mL/hr at 07/15/19 0647  . dextrose 5 % and 0.45% NaCl 75 mL/hr at 07/15/19 0600  . heparin 600 Units/hr (07/15/19 0907)  . insulin 1.1 mL/hr at 07/15/19 0600   PRN Meds:.acetaminophen **OR** acetaminophen, dextrose, gabapentin, ondansetron **OR** ondansetron (ZOFRAN) IV  Micro Results Recent Results (from the past 240 hour(s))  MRSA PCR Screening     Status: Abnormal   Collection Time: 07/14/19  6:57 PM   Specimen: Nasopharyngeal  Result Value Ref Range Status   MRSA by PCR POSITIVE (A) NEGATIVE Final    Comment:        The GeneXpert MRSA Assay (FDA approved for NASAL specimens only), is one component of a comprehensive MRSA colonization surveillance program. It is not intended to diagnose MRSA infection  nor to guide or monitor treatment for MRSA infections. RESULT CALLED TO, READ BACK BY AND VERIFIED WITH: RN TIFF @2040  07/14/19 AKT Performed at [facility] [address], [place], [place] [address]     Radiology Reports US Venous Img Lower Bilateral (DVT)  Result Date: 07/14/2019 CLINICAL DATA:  74 year old female with bilateral LOWER extremity pain for 3 months. EXAM: BILATERAL LOWER EXTREMITY VENOUS DOPPLER ULTRASOUND TECHNIQUE: Gray-scale sonography with compression, as well as color and duplex ultrasound, were performed to evaluate the deep venous system(s) from the level of the common femoral vein through the popliteal and proximal calf veins. COMPARISON:  None. FINDINGS: RIGHT: Normal compressibility of the common femoral, superficial femoral, and popliteal veins, as well as the visualized calf veins. Visualized portions of profunda femoral vein and great saphenous vein unremarkable. No filling defects to suggest DVT on grayscale or color Doppler imaging. Doppler waveforms show normal direction of venous flow, normal respiratory phasicity and response to augmentation. LEFT: Occlusive DVT is noted throughout the LEFT LOWER extremity including the common femoral, profundus femoral, femoral, popliteal and visualized calf veins. IMPRESSION: 1. Occlusive DVT throughout the visualized LEFT LOWER extremity. 2. No evidence of RIGHT LOWER extremity DVT. Critical Value/emergent results were called by telephone at the time of interpretation on 07/14/2019 at 5:59 pm to [facility] , who  verbally acknowledged these results. Electronically Signed   By: Margarette Canada M.D.   On: 07/14/2019 18:03   DG Chest Portable 1 View  Result Date: 07/14/2019 CLINICAL DATA:  Recent COVID-19 positive.  Hyperglycemia. EXAM: PORTABLE CHEST 1 VIEW COMPARISON:  None. FINDINGS: Lungs are clear. Heart size and pulmonary vascularity are normal. No adenopathy. There is aortic atherosclerosis. No bone lesions.  IMPRESSION: Lungs clear. Cardiac silhouette normal. No adenopathy. Aortic Atherosclerosis (ICD10-I70.0). Electronically Signed   By: Lowella Grip III M.D.   On: 07/14/2019 13:17    Time Spent in minutes 35   Cyndra Feinberg M.D on 07/15/2019 at 12:04 PM  Between 7am to 7pm - Pager - [phone]  After 7pm go to [email or web address] - password [facility]  [facility] -  Office  [phone]

## 2019-07-15 NOTE — Consult Note (Signed)
[facility] Vascular Consult Note  MRN : [id number]  Anne George is a 74 y.o. ([birth year]) female who presents with chief complaint of  Chief Complaint  Patient presents with  . Weakness   History of Present Illness:  The patient is a 74 year old female with multiple medical issues (see below) diagnosed with Covid approximately 2 weeks ago who presented to the emergency department complaining of "generalized weakness".  Approximately 2 weeks ago at the time of her Covid diagnosis the patient was experiencing fevers.  These have seemed to resolve and her cough is improved however she complains of significant generalized weakness.  She lives at home alone and has been unable to walk for the past couple days.  Per EMS was found sitting on the couch and has not moved from that location in 2 days.    Upon work-up in our emergency department the patient was found to be in acute on chronic renal failure.  Baseline kidney function stage IV.  Patient with some left lower extremity discomfort.  A Doppler was ordered and the patient was found to have:  Venous Duplex (07/14/19): 1. Occlusive DVT throughout the visualized LEFT LOWER extremity. 2. No evidence of RIGHT LOWER extremity DVT.  Vascular surgery was consulted by Dr. Clementeen Graham for further recommendations.   Current Facility-Administered Medications  Medication Dose Route Frequency Provider Last Rate Last Admin  . 0.9 %  sodium chloride infusion   Intravenous Continuous Dhungel, Nishant, MD 75 mL/hr at 07/15/19 0647 New Bag at 07/15/19 0647  . acetaminophen (TYLENOL) tablet 650 mg  650 mg Oral Q6H PRN Dhungel, Nishant, MD       Or  . acetaminophen (TYLENOL) suppository 650 mg  650 mg Rectal Q6H PRN Dhungel, Nishant, MD      . amLODipine (NORVASC) tablet 2.5 mg  2.5 mg Oral Daily Dhungel, Nishant, MD   2.5 mg at 07/15/19 0913  . aspirin EC tablet 81 mg  81 mg Oral Daily Dhungel, Nishant, MD   81 mg at 07/15/19 0915   . atorvastatin (LIPITOR) tablet 80 mg  80 mg Oral Daily Dhungel, Nishant, MD   80 mg at 07/15/19 0914  . Chlorhexidine Gluconate Cloth 2 % PADS 6 each  6 each Topical Q0600 Lang Snow, NP      . cholecalciferol (VITAMIN D3) tablet 5,000 Units  5,000 Units Oral Daily Dhungel, Nishant, MD   5,000 Units at 07/15/19 0915  . dextrose 50 % solution 0-50 mL  0-50 mL Intravenous PRN Dhungel, Nishant, MD      . gabapentin (NEURONTIN) capsule 300 mg  300 mg Oral TID PRN Dhungel, Nishant, MD      . heparin ADULT infusion 100 units/mL (25000 units/261mL sodium chloride 0.45%)  600 Units/hr Intravenous Continuous Dhungel, Nishant, MD 6 mL/hr at 07/15/19 0907 600 Units/hr at 07/15/19 0907  . insulin aspart (novoLOG) injection 0-5 Units  0-5 Units Subcutaneous QHS Ouma, Bing Neighbors, NP      . insulin aspart (novoLOG) injection 0-9 Units  0-9 Units Subcutaneous TID WC Ouma, Bing Neighbors, NP      . insulin aspart (novoLOG) injection 5 Units  5 Units Subcutaneous TID WC Dhungel, Nishant, MD      . Derrill Memo ON 07/16/2019] insulin glargine (LANTUS) injection 22 Units  22 Units Subcutaneous Q24H Dhungel, Nishant, MD      . insulin regular, human (MYXREDLIN) 100 units/ 100 mL infusion   Intravenous Continuous Dhungel, Nishant, MD 1.1 mL/hr at 07/15/19 0600  Rate Verify at 07/15/19 0600  . metoprolol succinate (TOPROL-XL) 24 hr tablet 25 mg  25 mg Oral Daily Dhungel, Nishant, MD   25 mg at 07/15/19 0912  . multivitamin with minerals tablet 1 tablet  1 tablet Oral Daily Dhungel, Nishant, MD   1 tablet at 07/15/19 0917  . mupirocin ointment (BACTROBAN) 2 % 1 application  1 application Nasal BID Lang Snow, NP   1 application at [id number] [phone]  . ondansetron (ZOFRAN) tablet 4 mg  4 mg Oral Q6H PRN Dhungel, Nishant, MD       Or  . ondansetron (ZOFRAN) injection 4 mg  4 mg Intravenous Q6H PRN Dhungel, Nishant, MD      . pantoprazole (PROTONIX) EC tablet 40 mg  40 mg Oral Daily Dhungel,  Nishant, MD   40 mg at 07/15/19 0913  . potassium chloride SA (KLOR-CON) CR tablet 40 mEq  40 mEq Oral Once Dhungel, Nishant, MD      . sodium bicarbonate tablet 650 mg  650 mg Oral BID Dhungel, Nishant, MD   650 mg at 07/15/19 0918  . venlafaxine XR (EFFEXOR-XR) 24 hr capsule 75 mg  75 mg Oral Daily Dhungel, Nishant, MD   75 mg at 07/15/19 [id number]   History reviewed. No pertinent past medical history.  History reviewed. No pertinent surgical history.  Social History Social History   Tobacco Use  . Smoking status: Never Smoker  . Smokeless tobacco: Never Used  Substance Use Topics  . Alcohol use: Not on file  . Drug use: Not on file   Family History History reviewed. No pertinent family history.  Denies family history of peripheral artery disease venous disease or renal disease.  No Known Allergies  REVIEW OF SYSTEMS (Negative unless checked)  Constitutional: [] Weight loss  [] Fever  [] Chills Cardiac: [] Chest pain   [] Chest pressure   [] Palpitations   [] Shortness of breath when laying flat   [] Shortness of breath at rest   [] Shortness of breath with exertion. Vascular:  [] Pain in legs with walking   [] Pain in legs at rest   [] Pain in legs when laying flat   [] Claudication   [] Pain in feet when walking  [] Pain in feet at rest  [] Pain in feet when laying flat   [] History of DVT   [] Phlebitis   [x] Swelling in legs   [] Varicose veins   [] Non-healing ulcers Pulmonary:   [] Uses home oxygen   [] Productive cough   [] Hemoptysis   [] Wheeze  [] COPD   [] Asthma Neurologic:  [] Dizziness  [] Blackouts   [] Seizures   [] History of stroke   [] History of TIA  [] Aphasia   [] Temporary blindness   [] Dysphagia   [] Weakness or numbness in arms   [] Weakness or numbness in legs Musculoskeletal:  [] Arthritis   [] Joint swelling   [] Joint pain   [] Low back pain Hematologic:  [] Easy bruising  [] Easy bleeding   [] Hypercoagulable state   [] Anemic  [] Hepatitis Gastrointestinal:  [] Blood in stool   [] Vomiting blood   [] Gastroesophageal reflux/heartburn   [] Difficulty swallowing. Genitourinary:  [x] Chronic kidney disease   [] Difficult urination  [] Frequent urination  [] Burning with urination   [] Blood in urine Skin:  [] Rashes   [x] Ulcers   [x] Wounds Psychological:  [] History of anxiety   []  History of major depression.  Physical Examination  Vitals:   07/15/19 0900 07/15/19 0912 07/15/19 1000 07/15/19 1100  BP: 135/77 (!) 146/78 (!) 134/56 (!) 123/53  Pulse: 90 90 82 77  Resp: 18  15 18   Temp:  TempSrc:      SpO2: 99%  95% 98%  Weight:      Height:       Body mass index is 34.13 kg/m. Gen:  WD/WN, NAD Head: [place]/AT, No temporalis wasting. Prominent temp pulse not noted. Ear/Nose/Throat: Hearing grossly intact, nares w/o erythema or drainage, oropharynx w/o Erythema/Exudate Eyes: Sclera non-icteric, conjunctiva clear Neck: Trachea midline.  No JVD.  Pulmonary:  Good air movement, respirations not labored, equal bilaterally.  Cardiac: RRR, normal S1, S2. Vascular:  Vessel Right Left  Radial Palpable Palpable  Ulnar Palpable Palpable  Brachial Palpable Palpable  Carotid Palpable, without bruit Palpable, without bruit  Aorta Not palpable N/A  Femoral Palpable Palpable  Popliteal Palpable Palpable  PT Non-Palpable Non-Palpable  DP Non-Palpable Non-Palpable   Left Lower Extremity: Thigh soft.  Calf soft.  There is no acute vascular compromise on exam.  Hard to palpate pedal pulses due to moderate edema however the foot is warm and there is good capillary refill.  No pain with palpation.  No pain with dorsiflexion.  Gastrointestinal: soft, non-tender/non-distended. No guarding/reflex.  Musculoskeletal: M/S 5/5 throughout.  Extremities without ischemic changes.  No deformity or atrophy. No edema. Neurologic: Sensation grossly intact in extremities.  Symmetrical.  Speech is fluent. Motor exam as listed above. Psychiatric: Judgment intact, Mood & affect appropriate for pt's clinical  situation. Dermatologic: Sacral Decub Lymph : No Cervical, Axillary, or Inguinal lymphadenopathy.  CBC Lab Results  Component Value Date   WBC 14.7 (H) 07/15/2019   HGB 12.7 07/15/2019   HCT 38.9 07/15/2019   MCV 87.0 07/15/2019   PLT 320 07/15/2019   BMET    Component Value Date/Time   NA 133 (L) 07/15/2019 1004   K 3.1 (L) 07/15/2019 1004   CL 97 (L) 07/15/2019 1004   CO2 25 07/15/2019 1004   GLUCOSE 185 (H) 07/15/2019 1004   BUN 88 (H) 07/15/2019 1004   CREATININE 2.86 (H) 07/15/2019 1004   CALCIUM 8.7 (L) 07/15/2019 1004   GFRNONAA 16 (L) 07/15/2019 1004   GFRAA 18 (L) 07/15/2019 1004   Estimated Creatinine Clearance: 15.9 mL/min (A) (by C-G formula based on SCr of 2.86 mg/dL (H)).  COAG Lab Results  Component Value Date   INR 1.4 (H) 07/14/2019   Radiology US Venous Img Lower Bilateral (DVT)  Result Date: 07/14/2019 CLINICAL DATA:  74 year old female with bilateral LOWER extremity pain for 3 months. EXAM: BILATERAL LOWER EXTREMITY VENOUS DOPPLER ULTRASOUND TECHNIQUE: Gray-scale sonography with compression, as well as color and duplex ultrasound, were performed to evaluate the deep venous system(s) from the level of the common femoral vein through the popliteal and proximal calf veins. COMPARISON:  None. FINDINGS: RIGHT: Normal compressibility of the common femoral, superficial femoral, and popliteal veins, as well as the visualized calf veins. Visualized portions of profunda femoral vein and great saphenous vein unremarkable. No filling defects to suggest DVT on grayscale or color Doppler imaging. Doppler waveforms show normal direction of venous flow, normal respiratory phasicity and response to augmentation. LEFT: Occlusive DVT is noted throughout the LEFT LOWER extremity including the common femoral, profundus femoral, femoral, popliteal and visualized calf veins. IMPRESSION: 1. Occlusive DVT throughout the visualized LEFT LOWER extremity. 2. No evidence of RIGHT LOWER  extremity DVT. Critical Value/emergent results were called by telephone at the time of interpretation on 07/14/2019 at 5:59 pm to [facility] , who verbally acknowledged these results. Electronically Signed   By: Margarette Canada M.D.   On: 07/14/2019 18:03  DG Chest Portable 1 View  Result Date: 07/14/2019 CLINICAL DATA:  Recent COVID-19 positive.  Hyperglycemia. EXAM: PORTABLE CHEST 1 VIEW COMPARISON:  None. FINDINGS: Lungs are clear. Heart size and pulmonary vascularity are normal. No adenopathy. There is aortic atherosclerosis. No bone lesions. IMPRESSION: Lungs clear. Cardiac silhouette normal. No adenopathy. Aortic Atherosclerosis (ICD10-I70.0). Electronically Signed   By: Lowella Grip III M.D.   On: 07/14/2019 13:17   Assessment/Plan The patient is a 74 year old female with multiple medical issues (see below) diagnosed with Covid approximately 2 weeks ago who presented to the emergency department complaining of "generalized weakness".  1. Left Lower Extremity DVT: Patient presents with an extensive occlusive left lower extremity DVT.  Patient would benefit from a venous thrombectomy/thrombolysis and attempt to lessen the clot burden, improve symptoms, decrease chance of pulmonary embolus as well as post phlebitic changes to the extremity.  In the setting of acute on chronic kidney disease would recommend waiting a few days and attempt to allow an improvement in kidney function.  Every effort to use minimal contrast will be given.  Procedure, risks and benefits explained to the patient.  All questions answered.  The patient wishes to proceed.  We will plan on this on Friday.  Continue heparin at this time.  2. Covid 19 Infection:  Symptoms have been improving.  Primary team is following.  3.  Chronic kidney disease stage IV Patient with acute on chronic kidney disease on admission.  Will hold off a couple of days to see if any improvement kidney function.  If there is some  improvement, will move forward with procedure minimizing the use of contrast.  Discussed with Dr. Mayme Genta, PA-C  07/15/2019 1:22 PM    This note was created with Dragon medical transcription system.  Any error is purely unintentional

## 2019-07-15 NOTE — Progress Notes (Addendum)
Inpatient Diabetes Program Recommendations  AACE/ADA: New Consensus Statement on Inpatient Glycemic Control (2015)  Target Ranges:  Prepandial:   less than 140 mg/dL      Peak postprandial:   less than 180 mg/dL (1-2 hours)      Critically ill patients:  140 - 180 mg/dL   Lab Results  Component Value Date   GLUCAP 209 (H) 07/15/2019   HGBA1C 8.4 (H) 07/14/2019    Review of Glycemic Control Results for Anne George, Anne George (MRN [id number]) as of 07/15/2019 09:51  Ref. Range 07/15/2019 04:01 07/15/2019 05:10 07/15/2019 08:23 07/15/2019 08:26 07/15/2019 08:50  Glucose-Capillary Latest Ref Range: 70 - 99 mg/dL 117 (H) 119 (H) 43 (LL) 45 (L) 209 (H)   Diabetes history: DM1 Outpatient Diabetes medications: Lantus 28 units qd + Novolog 2 units per 15 gms carbohydrate 3-5 x daily meal coverage Current orders for Inpatient glycemic control: Lantus 20 units qd + Novolog 3 units tid meal coverage if eats 50% + Novolog sensitive correction tid + hs 0-5 units  Inpatient Diabetes Program Recommendations:   -Increase Lantus to 22 units -Hold Novolog meal coverage until patient is eating Will follow during hospitalization.  Thank you, Nani Gasser. Twinkle Sockwell, RN, MSN, CDE  Diabetes Coordinator Inpatient Glycemic Control Team Team Pager [phone] (8am-5pm) 07/15/2019 9:57 AM

## 2019-07-15 NOTE — Consult Note (Signed)
ANTICOAGULATION CONSULT NOTE - Initial Consult  Pharmacy Consult for Heparin Drip Indication: VTE treatment  No Known Allergies  Patient Measurements: Height: 4\' 11"  (149.9 cm) Weight: 169 lb (76.7 kg) IBW/kg (Calculated) : 43.2 Heparin Dosing Weight: 60.8kg  Vital Signs: Temp: 97.6 F (36.4 C) (01/26 0400) Temp Source: Axillary (01/26 0400) BP: 113/80 (01/26 0600) Pulse Rate: 79 (01/26 0600)  Labs: Recent Labs    07/14/19 1222 07/14/19 1529 07/14/19 2133 07/15/19 0426  HGB 13.5  --   --  12.7  HCT 44.6  --   --  38.9  PLT 301  --   --  320  APTT  --   --  >160*  --   LABPROT  --   --  17.0*  --   INR  --   --  1.4*  --   HEPARINUNFRC  --   --   --  2.68*  CREATININE  --  3.54* 3.28* 2.91*  TROPONINIHS  --  14 25*  --     Estimated Creatinine Clearance: 15.6 mL/min (A) (by C-G formula based on SCr of 2.91 mg/dL (H)).  Medications:  No pta anticoagulant of record  Assessment: Anne George is a 74 y.o. female with past medical history of diabetes, good fluid removal, colostomy, and recent history of COVID-19 infection presents to the ED complaining of generalized weakness.  Pharmacy has been consulted to initiate and monitor heparin drip for VTE treatment.   Goal of Therapy:  Heparin level 0.3-0.7 units/ml Monitor platelets by anticoagulation protocol: Yes   Plan:  0126 @ 0430 HL 2.68 supratherapeutic. Will hold drip until 0844 and restart at a rate of 600 units/hr and will recheck HL at 1700, CBC trended down slightly, will continue to monitor.  Tobie Lords, PharmD, BCPS Clinical Pharmacist 07/15/2019 6:46 AM

## 2019-07-16 ENCOUNTER — Encounter: Payer: Medicare PPO | Admitting: Occupational Therapy

## 2019-07-16 LAB — GLUCOSE, CAPILLARY
Glucose-Capillary: 225 mg/dL — ABNORMAL HIGH (ref 70–99)
Glucose-Capillary: 324 mg/dL — ABNORMAL HIGH (ref 70–99)
Glucose-Capillary: 207 mg/dL — ABNORMAL HIGH (ref 70–99)
Glucose-Capillary: 224 mg/dL — ABNORMAL HIGH (ref 70–99)

## 2019-07-16 LAB — BASIC METABOLIC PANEL
Anion gap: 9 (ref 5–15)
BUN: 75 mg/dL — ABNORMAL HIGH (ref 8–23)
CO2: 21 mmol/L — ABNORMAL LOW (ref 22–32)
Calcium: 8.3 mg/dL — ABNORMAL LOW (ref 8.9–10.3)
Chloride: 106 mmol/L (ref 98–111)
Creatinine, Ser: 2.38 mg/dL — ABNORMAL HIGH (ref 0.44–1.00)
GFR calc Af Amer: 23 mL/min — ABNORMAL LOW (ref >60)
GFR calc non Af Amer: 20 mL/min — ABNORMAL LOW (ref >60)
Glucose, Bld: 218 mg/dL — ABNORMAL HIGH (ref 70–99)
Potassium: 4 mmol/L (ref 3.5–5.1)
Sodium: 136 mmol/L (ref 135–145)

## 2019-07-16 LAB — CBC
HCT: 36.6 % (ref 36.0–46.0)
Hemoglobin: 11.8 g/dL — ABNORMAL LOW (ref 12.0–15.0)
MCH: 28.6 pg (ref 26.0–34.0)
MCHC: 32.2 g/dL (ref 30.0–36.0)
MCV: 88.8 fL (ref 80.0–100.0)
Platelets: 288 10*3/uL (ref 150–400)
RBC: 4.12 MIL/uL (ref 3.87–5.11)
RDW: 12.5 % (ref 11.5–15.5)
WBC: 17.2 10*3/uL — ABNORMAL HIGH (ref 4.0–10.5)
nRBC: 0 % (ref 0.0–0.2)

## 2019-07-16 LAB — C-REACTIVE PROTEIN: CRP: 7.2 mg/dL — ABNORMAL HIGH (ref <1.0)

## 2019-07-16 LAB — HEPARIN LEVEL (UNFRACTIONATED)
Heparin Unfractionated: 1.37 IU/mL — ABNORMAL HIGH (ref 0.30–0.70)
Heparin Unfractionated: 0.78 IU/mL — ABNORMAL HIGH (ref 0.30–0.70)
Heparin Unfractionated: 0.34 IU/mL (ref 0.30–0.70)

## 2019-07-16 LAB — FIBRIN DERIVATIVES D-DIMER (ARMC ONLY): Fibrin derivatives D-dimer (ARMC): >7500 ng/mL (FEU) — ABNORMAL HIGH (ref 0.00–499.00)

## 2019-07-16 MED ORDER — HEPARIN (PORCINE) 25000 UT/250ML-% IV SOLN
600.0000 [IU]/h | INTRAVENOUS | Status: DC
  Administered 2019-07-17: 600 [IU]/h via INTRAVENOUS
  Filled 2019-07-16: qty 250

## 2019-07-16 NOTE — Progress Notes (Signed)
[facility] Progress Note  Patient: Anne George    [id number]  DOA: 07/14/2019     Date of Service: the patient was seen and examined on 07/16/2019  Chief Complaint  Patient presents with  . Weakness   Brief hospital course: Type I DM, CKD stage IV, chronic lymphedema, rectal cancer SP colostomy, anemia of chronic disease, HTN, chronic venous stasis.  Patient presented with complaints of generalized fatigue and weakness.  She also has poor living conditions reported by EMS.  Had acute kidney injury on chronic kidney disease along with DVT in COVID-19 infection. Currently further plan is continue.  Assessment and Plan: 1.  DKA Type 1 diabetes mellitus with chronic insulin use and chronic kidney disease as well as diabetic retinopathy. Patient actually had anion gap of 18 at the time of admission. Also had acute on chronic kidney disease with severe metabolic acidosis. Blood glucose was 645 and bicarb of 19. Patient was started on IV insulin with Endo tool and IV fluids. Anion gap closed and currently glucose are well controlled. Appreciate diabetic educator. Patient has Longstanding T1 DM since age 19. Hemoglobin A1c 8.4 this admission, 7.6 prior to that. Continue basal bolus sliding scale regimen.  2.  Acute kidney injury on chronic kidney disease stage IV. Combination of progression of the renal disease as well as dehydration and poor p.o. intake. Continuing on IV fluids with improvement in renal function. Baseline renal function around 2.5-3.0. Monitor.  3.  COVID-19 infection.  Diagnosed 07/02/2019 never treated. Currently on room air. Significant elevated D-dimer and CRP. Chest x-ray also negative for any acute infiltrate. Monitor for now.  4.  Acute left lower leg DVT Patient is swelling of the leg with pain. Doppler is positive. Suspect in the setting of limited mobility due to generalized weakness and dehydration as well as acute COVID-19  illness. Currently on therapeutic heparin. Vascular surgery consulted and recommend venous thrombectomy/thrombolysis  5.  Essential hypertension Continue current regimen.  6.  Diabetic neuropathy Continue Neurontin.  7.  Obesity Body mass index is 34.13 kg/m.  Outpatient dietary consultation.  8.  Social/poor living condition. Social worker consulted. PT also consulted.  9.  Pressure ulcer left buttock, right sacrum unstageable.  POA. Continue foam dressing and frequent turning Pressure Injury 07/14/19 Buttocks Left Unstageable - Full thickness tissue loss in which the base of the injury is covered by slough (yellow, tan, gray, green or brown) and/or eschar (tan, brown or black) in the wound bed. (Active)  07/14/19 1900  Location: Buttocks  Location Orientation: Left  Staging: Unstageable - Full thickness tissue loss in which the base of the injury is covered by slough (yellow, tan, gray, green or brown) and/or eschar (tan, brown or black) in the wound bed.  Wound Description (Comments):   Present on Admission: Yes     Pressure Injury 07/14/19 Sacrum Right Unstageable - Full thickness tissue loss in which the base of the injury is covered by slough (yellow, tan, gray, green or brown) and/or eschar (tan, brown or black) in the wound bed. (Active)  07/14/19 1900  Location: Sacrum  Location Orientation: Right  Staging: Unstageable - Full thickness tissue loss in which the base of the injury is covered by slough (yellow, tan, gray, green or brown) and/or eschar (tan, brown or black) in the wound bed.  Wound Description (Comments):   Present on Admission: Yes     Diet: Carb modified diet DVT Prophylaxis: Therapeutic Anticoagulation with Heparin   Advance goals of  care discussion: Full code  Family Communication: no family was present at bedside, at the time of interview.   Disposition:  Pt is from home, lives alone, admitted with generalized fatigue fall, DVT, poor living  condition and acute kidney injury on chronic kidney disease stage IV, still has worsening renal function as well as ongoing DVT which requires intervention when safe, which precludes a safe discharge. Discharge to be determined  Subjective: Patient continues to have complaints of pain in the leg.  No nausea no vomiting.  No breathing issues.  No chest pain.  Physical Exam: General:  alert oriented to time, place, and person.  Appear in mild distress, affect appropriate Eyes: PERRL ENT: Oral Mucosa Clear, moist  Neck: no JVD,  Cardiovascular: S1 and S2 Present, no Murmur,  Respiratory: good respiratory effort, Bilateral Air entry equal and Decreased, no Crackles, no wheezes Abdomen: Bowel Sound present, Soft and no tenderness,  Skin: no rash  Extremities: bilateral  Pedal edema, no calf tenderness Neurologic: without any new focal findings  Gait not checked due to patient safety concerns  Vitals:   07/16/19 0500 07/16/19 0738 07/16/19 0859 07/16/19 0900  BP:  (!) 143/45 (!) 142/55   Pulse: 71 75  74  Resp: 16 16    Temp:  98.3 F (36.8 C)    TempSrc:  Oral    SpO2: 97% 97%    Weight:      Height:        Intake/Output Summary (Last 24 hours) at 07/16/2019 1618 Last data filed at 07/16/2019 0600 Gross per 24 hour  Intake --  Output 1000 ml  Net -1000 ml   Filed Weights   07/14/19 1219  Weight: 76.7 kg    Data Reviewed: I have personally reviewed and interpreted daily labs, tele strips, imagings as discussed above. I reviewed all nursing notes, pharmacy notes, vitals, pertinent old records I have discussed plan of care as described above with RN and patient/family.  CBC: Recent Labs  Lab 07/14/19 1222 07/15/19 0426 07/16/19 0310  WBC 9.7 14.7* 17.2*  NEUTROABS 8.1*  --   --   HGB 13.5 12.7 11.8*  HCT 44.6 38.9 36.6  MCV 93.5 87.0 88.8  PLT 301 320 [id number]   Basic Metabolic Panel: Recent Labs  Lab 07/14/19 1529 07/14/19 2133 07/15/19 0426 07/15/19 1004  07/16/19 0307  NA 129* 133* 135 133* 136  K 5.0 3.9 3.8 3.1* 4.0  CL 92* 96* 99 97* 106  CO2 19* 21* 23 25 21*  GLUCOSE 645* 351* 112* 185* 218*  BUN 95* 95* 92* 88* 75*  CREATININE 3.54* 3.28* 2.91* 2.86* 2.38*  CALCIUM 8.9 8.9 8.9 8.7* 8.3*    Studies: No results found.  Scheduled Meds: . amLODipine  2.5 mg Oral Daily  . aspirin EC  81 mg Oral Daily  . atorvastatin  80 mg Oral Daily  . Chlorhexidine Gluconate Cloth  6 each Topical Q0600  . cholecalciferol  5,000 Units Oral Daily  . insulin aspart  0-5 Units Subcutaneous QHS  . insulin aspart  0-9 Units Subcutaneous TID WC  . insulin aspart  5 Units Subcutaneous TID WC  . insulin detemir  22 Units Subcutaneous Daily  . metoprolol succinate  25 mg Oral Daily  . multivitamin with minerals  1 tablet Oral Daily  . mupirocin ointment  1 application Nasal BID  . pantoprazole  40 mg Oral Daily  . sodium bicarbonate  650 mg Oral BID  . venlafaxine XR  75 mg Oral Daily   Continuous Infusions: . sodium chloride 100 mL/hr at 07/16/19 0408  . heparin 600 Units/hr (07/16/19 1539)   PRN Meds: acetaminophen **OR** acetaminophen, dextrose, gabapentin, ondansetron **OR** ondansetron (ZOFRAN) IV  Time spent: 35 minutes  Author: Berle Mull, MD [facility] 07/16/2019 4:18 PM  To reach On-call, see care teams to locate the attending and reach out to them via [email or web address]. If 7PM-7AM, please contact night-coverage If you still have difficulty reaching the attending provider, please page the [facility] (Director on Call) for [facility] on amion for assistance.

## 2019-07-16 NOTE — TOC Initial Note (Signed)
Transition of Care [facility]) - Initial/Assessment Note    Patient Details  Name: Anne George MRN: [id number] Date of Birth: September 12, 1945  [facility] CM/SW Contact:    Shelbie Hutching, RN Phone Number: 07/16/2019, 11:01 AM  Clinical Narrative:                 Patient admitted with hyperglycemia, weakness, and COVID 19 infection.  Patient is from home where she lives alone and has been independent.  RNCM attempted to call patient in room and on her cell phone with no answer.  RNCM was able to reach her son, listed as emergency contact, Shanon Brow.  Shanon Brow lives in [place] and reports that his mother walks with a cane but has a walker available at home.  Patient does not drive but Shanon Brow or his wife provide transportation.  Shanon Brow says that if patient needs to have someone with her when she is discharged she can go and stay with him and his wife.  Shanon Brow is in agreement with home health services.  Shanon Brow knows someone that works for one of the [employer] and would like to find out which agency and use that one.  PT consult has been ordered.   RNCM will cont to follow and assist with any needs.    Expected Discharge Plan: [place] Barriers to Discharge: Continued Medical Work up   Patient Goals and CMS Choice   CMS Medicare.gov Compare Post Acute Care list provided to:: Patient Represenative (must comment) Choice offered to / list presented to : Patient, Adult Children  Expected Discharge Plan and Services Expected Discharge Plan: [place]   Discharge Planning Services: CM Consult Post Acute Care Choice: [place] arrangements for the past 2 months: Mobile Home                                      Prior Living Arrangements/Services Living arrangements for the past 2 months: Mobile Home Lives with:: Self Patient language and need for interpreter reviewed:: Yes(English) Do you feel safe going back to the place where you live?:  Yes      Need for Family Participation in Patient Care: Yes (Comment)(COVID) Care giver support system in place?: Yes (comment)(son and daughter in law) Current home services: DME(walker and cane) Criminal Activity/Legal Involvement Pertinent to Current Situation/Hospitalization: No - Comment as needed  Activities of Daily Living Home Assistive Devices/Equipment: Reacher, Raised toilet seat with rails, Cane (specify quad or straight) ADL Screening (condition at time of admission) Patient's cognitive ability adequate to safely complete daily activities?: Yes Is the patient deaf or have difficulty hearing?: Yes Does the patient have difficulty seeing, even when wearing glasses/contacts?: Yes Does the patient have difficulty concentrating, remembering, or making decisions?: No Patient able to express need for assistance with ADLs?: Yes Does the patient have difficulty dressing or bathing?: Yes Independently performs ADLs?: No(home health nurse) Communication: Independent with device (comment) Dressing (OT): Needs assistance Is this a change from baseline?: Pre-admission baseline Grooming: Needs assistance Is this a change from baseline?: Pre-admission baseline Feeding: Independent with device (comment) Bathing: Needs assistance Is this a change from baseline?: Pre-admission baseline Toileting: Independent with device (comment) In/Out Bed: Independent with device (comment) Walks in Home: (S) Needs assistance(cane) Is this a change from baseline?: Pre-admission baseline Does the patient have difficulty walking or climbing stairs?: Yes Weakness of  Legs: Both Weakness of Arms/Hands: None  Permission Sought/Granted Permission sought to share information with : Case Manager, Family Supports, Other (comment) Permission granted to share information with : Yes, Verbal Permission Granted     Permission granted to share info w AGENCY: Home health agency of choice  Permission granted to share  info w Relationship: Son     Emotional Assessment       Orientation: : Oriented to Self, Oriented to Place, Oriented to  Time, Oriented to Situation Alcohol / Substance Use: Not Applicable Psych Involvement: No (comment)  Admission diagnosis:  Hyperglycemia [R73.9] Generalized weakness [R53.1] AKI (acute kidney injury) ([place]) [N17.9] Skin ulcer of sacrum, limited to breakdown of skin ([place]) [L98.421] 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 DVT, lower extremity, proximal, acute, left ([place]) 07/14/2019  . COVID-19 virus infection 07/14/2019  . Acute renal failure superimposed on stage 4 chronic kidney disease ([place]) 07/14/2019  . Essential hypertension 07/14/2019  . FTT (failure to thrive) in adult 07/14/2019  . Obesity (BMI 30.0-34.9) 07/14/2019  . Skin ulcer of sacrum, limited to breakdown of skin ([place])    PCP:  Joseph Pierini, MD Pharmacy:   [facility] [place] [address] Phone: [phone] Fax: [phone]     Social Determinants of Health (SDOH) Interventions    Readmission Risk Interventions No flowsheet data found.

## 2019-07-16 NOTE — Consult Note (Signed)
ANTICOAGULATION CONSULT NOTE - Initial Consult  Pharmacy Consult for Heparin Drip Indication: VTE treatment  No Known Allergies  Patient Measurements: Height: 4\' 11"  (149.9 cm) Weight: 169 lb (76.7 kg) IBW/kg (Calculated) : 43.2 Heparin Dosing Weight: 60.8kg  Vital Signs: Temp: 97.8 F (36.6 C) (01/27 1623) Temp Source: Oral (01/27 1623) BP: 117/38 (01/27 1623) Pulse Rate: 79 (01/27 1623)  Labs: Recent Labs    07/14/19 1222 07/14/19 1222 07/14/19 1529 07/14/19 1529 07/14/19 2133 07/15/19 0426 07/15/19 0426 07/15/19 1004 07/15/19 1809 07/16/19 0307 07/16/19 0310 07/16/19 1300 07/16/19 2224  HGB 13.5   < >  --   --   --  12.7  --   --   --   --  11.8*  --   --   HCT 44.6  --   --   --   --  38.9  --   --   --   --  36.6  --   --   PLT 301  --   --   --   --  320  --   --   --   --  288  --   --   APTT  --   --   --   --  >160*  --   --   --   --   --   --   --   --   LABPROT  --   --   --   --  17.0*  --   --   --   --   --   --   --   --   INR  --   --   --   --  1.4*  --   --   --   --   --   --   --   --   HEPARINUNFRC  --   --   --   --   --  2.68*   < > 1.21*   < > 1.37*  --  0.78* 0.34  CREATININE  --    < > 3.54*   < > 3.28* 2.91*  --  2.86*  --  2.38*  --   --   --   TROPONINIHS  --   --  14  --  25*  --   --   --   --   --   --   --   --    < > = values in this interval not displayed.    Estimated Creatinine Clearance: 19.1 mL/min (A) (by C-G formula based on SCr of 2.38 mg/dL (H)).  Medications:  No pta anticoagulant of record  Assessment: Anne George is a 74 y.o. female with past medical history of diabetes, good fluid removal, colostomy, and recent history of COVID-19 infection presents to the ED complaining of generalized weakness.  Pharmacy has been consulted to initiate and monitor heparin drip for VTE treatment.                          HL 01/26 0426  2.68 (@950  units/hr) 01/26 1004      1.21 (@ 750 units/hr) 01/26 1809    <0.10 (@ 600  units/hr) 01/27 0307      1.37 (@ 750 units/hr)  supratherapeutic- rate reduced to (650 units/hr)   Goal of Therapy:  Heparin level 0.3-0.7 units/ml Monitor platelets by anticoagulation protocol: Yes   Plan:  01/27 @ 2200 HL 0.34 therapeutic. Will continue current rate and will recheck HL w/ am labs and will continue to monitor.  Tobie Lords, PharmD, BCPS Clinical Pharmacist 07/16/2019 11:20 PM

## 2019-07-16 NOTE — Progress Notes (Signed)
Inpatient Diabetes Program Recommendations  AACE/ADA: New Consensus Statement on Inpatient Glycemic Control (2015)  Target Ranges:  Prepandial:   less than 140 mg/dL      Peak postprandial:   less than 180 mg/dL (1-2 hours)      Critically ill patients:  140 - 180 mg/dL   Lab Results  Component Value Date   GLUCAP 225 (H) 07/16/2019   HGBA1C 8.4 (H) 07/14/2019    Review of Glycemic Control Results for Anne George, Anne George (MRN [id number]) as of 07/16/2019 10:53  Ref. Range 07/15/2019 08:26 07/15/2019 08:50 07/15/2019 11:50 07/15/2019 15:17 07/16/2019 07:40  Glucose-Capillary Latest Ref Range: 70 - 99 mg/dL 45 (L) 209 (H) 129 (H) 94 225 (H)    Inpatient Diabetes Program Recommendations:   Noted CBG not documented from 94 @ 1517 yesterday until today @ 0740 of 225. Spoke with patient briefly via phone and explained need for CBGs and correction to assist with controlling blood sugars while in the hospital. Patient verbalized understanding.  Thank you, Nani Gasser. [place] Pettengill, RN, MSN, CDE  Diabetes Coordinator Inpatient Glycemic Control Team Team Pager [phone] (8am-5pm) 07/16/2019 11:08 AM

## 2019-07-16 NOTE — Consult Note (Signed)
ANTICOAGULATION CONSULT NOTE - Initial Consult  Pharmacy Consult for Heparin Drip Indication: VTE treatment  No Known Allergies  Patient Measurements: Height: 4\' 11"  (149.9 cm) Weight: 169 lb (76.7 kg) IBW/kg (Calculated) : 43.2 Heparin Dosing Weight: 60.8kg  Vital Signs: Temp: 98.3 F (36.8 C) (01/27 0738) Temp Source: Oral (01/27 0738) BP: 142/55 (01/27 0859) Pulse Rate: 74 (01/27 0900)  Labs: Recent Labs    07/14/19 1222 07/14/19 1222 07/14/19 1529 07/14/19 1529 07/14/19 2133 07/15/19 0426 07/15/19 0426 07/15/19 1004 07/15/19 1004 07/15/19 1809 07/16/19 0307 07/16/19 0310 07/16/19 1300  HGB 13.5   < >  --   --   --  12.7  --   --   --   --   --  11.8*  --   HCT 44.6  --   --   --   --  38.9  --   --   --   --   --  36.6  --   PLT 301  --   --   --   --  320  --   --   --   --   --  288  --   APTT  --   --   --   --  >160*  --   --   --   --   --   --   --   --   LABPROT  --   --   --   --  17.0*  --   --   --   --   --   --   --   --   INR  --   --   --   --  1.4*  --   --   --   --   --   --   --   --   HEPARINUNFRC  --   --   --   --   --  2.68*   < > 1.21*   < > <0.10* 1.37*  --  0.78*  CREATININE  --    < > 3.54*   < > 3.28* 2.91*  --  2.86*  --   --  2.38*  --   --   TROPONINIHS  --   --  14  --  25*  --   --   --   --   --   --   --   --    < > = values in this interval not displayed.    Estimated Creatinine Clearance: 19.1 mL/min (A) (by C-G formula based on SCr of 2.38 mg/dL (H)).  Medications:  No pta anticoagulant of record  Assessment: Anne George is a 74 y.o. female with past medical history of diabetes, good fluid removal, colostomy, and recent history of COVID-19 infection presents to the ED complaining of generalized weakness.  Pharmacy has been consulted to initiate and monitor heparin drip for VTE treatment.                          HL 01/26 0426  2.68 (@950  units/hr) 01/26 1004      1.21 (@ 750 units/hr) 01/26 1809    <0.10 (@ 600  units/hr) 01/27 0307      1.37 (@ 750 units/hr)  supratherapeutic- rate reduced to (650 units/hr)   Goal of Therapy:  Heparin level 0.3-0.7 units/ml Monitor platelets by anticoagulation protocol: Yes   Plan:  01/27 @ 1300 HL 0.78 mildly (@650  units/hr) supratherapeutic   Will reduce rate to 600 units/hr.    Will recheck HL in 8 hours per protocol.  Will continue to monitor CBC.  Lu Duffel, PharmD, BCPS Clinical Pharmacist 07/16/2019 1:50 PM

## 2019-07-16 NOTE — Consult Note (Signed)
ANTICOAGULATION CONSULT NOTE - Initial Consult  Pharmacy Consult for Heparin Drip Indication: VTE treatment  No Known Allergies  Patient Measurements: Height: 4\' 11"  (149.9 cm) Weight: 169 lb (76.7 kg) IBW/kg (Calculated) : 43.2 Heparin Dosing Weight: 60.8kg  Vital Signs: BP: 120/45 (01/26 2000) Pulse Rate: 69 (01/27 0200)  Labs: Recent Labs    07/14/19 1222 07/14/19 1222 07/14/19 1529 07/14/19 1529 07/14/19 2133 07/15/19 0426 07/15/19 0426 07/15/19 1004 07/15/19 1809 07/16/19 0307 07/16/19 0310  HGB 13.5   < >  --   --   --  12.7  --   --   --   --  11.8*  HCT 44.6  --   --   --   --  38.9  --   --   --   --  36.6  PLT 301  --   --   --   --  320  --   --   --   --  288  APTT  --   --   --   --  >160*  --   --   --   --   --   --   LABPROT  --   --   --   --  17.0*  --   --   --   --   --   --   INR  --   --   --   --  1.4*  --   --   --   --   --   --   HEPARINUNFRC  --   --   --   --   --  2.68*   < > 1.21* <0.10* 1.37*  --   CREATININE  --    < > 3.54*   < > 3.28* 2.91*  --  2.86*  --  2.38*  --   TROPONINIHS  --   --  14  --  25*  --   --   --   --   --   --    < > = values in this interval not displayed.    Estimated Creatinine Clearance: 19.1 mL/min (A) (by C-G formula based on SCr of 2.38 mg/dL (H)).  Medications:  No pta anticoagulant of record  Assessment: Anne George is a 74 y.o. female with past medical history of diabetes, good fluid removal, colostomy, and recent history of COVID-19 infection presents to the ED complaining of generalized weakness.  Pharmacy has been consulted to initiate and monitor heparin drip for VTE treatment.  1/26 0426 HL 2.68 Held for 2 hours and restart at a rate of 600 units/hr  1/26 1004 HL 1.21 No change  1/25 1809 HL < 0.1    Goal of Therapy:  Heparin level 0.3-0.7 units/ml Monitor platelets by anticoagulation protocol: Yes   Plan:  01/27 @ 0300 HL 1.37 supratherapeutic. Spoke to RN patient not having any  bleeding issues, also to her knowledge drip was never stopped or had any interruptions; therefore will hold for 1 hour and restart at 0500 at a rate of 650 units/hr and will recheck HL at 1300. Will continue to monitor CBC.  Tobie Lords, PharmD, BCPS Clinical Pharmacist 07/16/2019 4:00 AM

## 2019-07-16 NOTE — Evaluation (Signed)
Physical Therapy Evaluation Patient Details Name: Anne George MRN: [id number] DOB: [birth year] Today's Date: 07/16/2019   History of Present Illness  Pt is a 74 y.o. female presenting to hospital 07/14/19 with generalized weakness; fever and cough about 2 weeks ago; tested (+) for COVID-19 January 13th; unable to walk past couple days.  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  Plan for thrombectomy d/t extensive L LE DVT.  PMH includes DM, colostomy, CKD stage IV, chronic lymphedema, htn, anemia of chronic disease, chronic venous stasis.  Clinical Impression  Prior to hospital admission, pt was modified independent ambulating with cane; lives alone but per chart can stay with son and son's wife.  Currently pt is mod to max assist semi-supine to sitting edge of bed; min to mod assist x2 to stand from bed (x2 trials); and min assist x2 to take steps bed to recliner with B UE support.  Limited mobility d/t pt fatigue.  O2 sats 92% or greater on room air during sessions activities.  HR increased to 121-124 bpm with sitting edge of bed and with transfer to recliner; and HR increased briefly to 134 bpm with pt attempting to scoot hips back in recliner (therapist and nurse used sheet pad to scoot pt the rest of the way back into chair); HR decreased to 80's bpm with sitting rest in recliner.  Pt would benefit from skilled PT to address noted impairments and functional limitations (see below for any additional details).  Upon hospital discharge, pt would benefit from STR.    Follow Up Recommendations SNF    Equipment Recommendations  Rolling walker with 5" wheels;3in1 (PT)    Recommendations for Other Services OT consult     Precautions / Restrictions Precautions Precautions: Fall Precaution Comments: IV Heparin; colostomy Restrictions Weight Bearing Restrictions: No  Per discussion with vascular Marcelle Overlie PA) regarding L LE DVT on 1/27: ok to ambulate pt.     Mobility  Bed Mobility Overal bed mobility: Needs Assistance Bed Mobility: Supine to Sit     Supine to sit: Mod assist;Max assist;HOB elevated     General bed mobility comments: minimal assist for LE's (L>R LE) and mod assist for trunk; assist to scoot to edge of bed  Transfers Overall transfer level: Needs assistance Equipment used: None Transfers: Sit to/from Stand;Stand Pivot Transfers Sit to Stand: Min assist;Mod assist;+2 physical assistance Stand pivot transfers: Min assist;+2 physical assistance       General transfer comment: x2 trials standing (from bed) with UE support; stand step turn bed to recliner with min assist x2  Ambulation/Gait             General Gait Details: Deferred d/t pt fatigue after transfer to recliner  Stairs            Wheelchair Mobility    Modified Rankin (Stroke Patients Only)       Balance Overall balance assessment: Needs assistance Sitting-balance support: No upper extremity supported;Feet supported Sitting balance-Leahy Scale: Fair Sitting balance - Comments: steady static sitting   Standing balance support: Bilateral upper extremity supported Standing balance-Leahy Scale: Poor Standing balance comment: Pt requiring B UE support for static standing balance                             Pertinent Vitals/Pain Pain Assessment: No/denies pain    Home Living Family/patient expects  to be discharged to:: Private residence Living Arrangements: Alone Available Help at Discharge: Family Type of Home: Mobile home Home Access: Stairs to enter Entrance Stairs-Rails: Right;Left;Can reach both Entrance Stairs-Number of Steps: 4-5   [place]: [place] - 2 wheels;Cane - single point Additional Comments: Per chart pt can stay with son and son's wife    Prior Function Level of Independence: Independent with assistive device(s)          Comments: Ambulates with cane     Hand Dominance        Extremity/Trunk Assessment   Upper Extremity Assessment Upper Extremity Assessment: Generalized weakness    Lower Extremity Assessment Lower Extremity Assessment: Generalized weakness       Communication      Cognition Arousal/Alertness: Awake/alert Behavior During Therapy: WFL for tasks assessed/performed Overall Cognitive Status: Within Functional Limits for tasks assessed                                        General Comments   Nursing cleared pt for participation in physical therapy and present assisting pt with all mobility.  Pt agreeable to PT session.    Exercises     Assessment/Plan    PT Assessment Patient needs continued PT services  PT Problem List Decreased strength;Decreased activity tolerance;Decreased balance;Decreased mobility;Decreased knowledge of use of DME;Cardiopulmonary status limiting activity;Decreased knowledge of precautions;Pain       PT Treatment Interventions DME instruction;Gait training;Stair training;Functional mobility training;Therapeutic activities;Therapeutic exercise;Balance training;Patient/family education    PT Goals (Current goals can be found in the Care Plan section)  Acute Rehab PT Goals Patient Stated Goal: to get stronger PT Goal Formulation: With patient Time For Goal Achievement: 07/30/19 Potential to Achieve Goals: Fair    Frequency Min 2X/week   Barriers to discharge Decreased caregiver support      Co-evaluation               AM-PAC PT "6 Clicks" Mobility  Outcome Measure Help needed turning from your back to your side while in a flat bed without using bedrails?: A Lot Help needed moving from lying on your back to sitting on the side of a flat bed without using bedrails?: A Lot Help needed moving to and from a bed to a chair (including a wheelchair)?: A Lot Help needed standing up from a chair using your arms (e.g.,  wheelchair or bedside chair)?: A Lot Help needed to walk in hospital room?: Total Help needed climbing 3-5 steps with a George? : Total 6 Click Score: 10    End of Session Equipment Utilized During Treatment: Gait belt(up high away from colostomy) Activity Tolerance: Patient limited by fatigue Patient left: in chair;with call bell/phone within reach;with chair alarm set;with nursing/sitter in room;Other (comment)(B heels floating via pillow; nurse present and reported he would finish setting pt up (nurse was emptying colostomy bag)) Nurse Communication: Mobility status;Precautions;Other (comment)(pt's vitals; nurse present assisting with all OOB mobility) PT Visit Diagnosis: Other abnormalities of gait and mobility (R26.89);Muscle weakness (generalized) (M62.81);Difficulty in walking, not elsewhere classified (R26.2)    Time: 1531-1600 PT Time Calculation (min) (ACUTE ONLY): 29 min   Charges:   PT Evaluation $PT Eval Low Complexity: 1 Low PT Treatments $Therapeutic Activity: 8-22 mins        Leitha Bleak, PT 07/16/19, 5:16 PM

## 2019-07-17 ENCOUNTER — Encounter
Admission: EM | Disposition: A | Discharge status: Skilled Nursing Facility | Payer: Self-pay | Source: Home / Self Care | Attending: Internal Medicine

## 2019-07-17 ENCOUNTER — Other Ambulatory Visit (INDEPENDENT_AMBULATORY_CARE_PROVIDER_SITE_OTHER): Payer: Self-pay | Admitting: Vascular Surgery

## 2019-07-17 DIAGNOSIS — I82402 Acute embolism and thrombosis of unspecified deep veins of left lower extremity: Secondary | ICD-10-CM

## 2019-07-17 HISTORY — PX: PERIPHERAL VASCULAR THROMBECTOMY: CATH118306

## 2019-07-17 LAB — BASIC METABOLIC PANEL
Anion gap: 9 (ref 5–15)
BUN: 67 mg/dL — ABNORMAL HIGH (ref 8–23)
CO2: 24 mmol/L (ref 22–32)
Calcium: 8.7 mg/dL — ABNORMAL LOW (ref 8.9–10.3)
Chloride: 103 mmol/L (ref 98–111)
Creatinine, Ser: 2.11 mg/dL — ABNORMAL HIGH (ref 0.44–1.00)
GFR calc Af Amer: 26 mL/min — ABNORMAL LOW (ref >60)
GFR calc non Af Amer: 23 mL/min — ABNORMAL LOW (ref >60)
Glucose, Bld: 118 mg/dL — ABNORMAL HIGH (ref 70–99)
Potassium: 4.1 mmol/L (ref 3.5–5.1)
Sodium: 136 mmol/L (ref 135–145)

## 2019-07-17 LAB — CBC
HCT: 33.9 % — ABNORMAL LOW (ref 36.0–46.0)
Hemoglobin: 10.8 g/dL — ABNORMAL LOW (ref 12.0–15.0)
MCH: 28.6 pg (ref 26.0–34.0)
MCHC: 31.9 g/dL (ref 30.0–36.0)
MCV: 89.9 fL (ref 80.0–100.0)
Platelets: 265 10*3/uL (ref 150–400)
RBC: 3.77 MIL/uL — ABNORMAL LOW (ref 3.87–5.11)
RDW: 12.5 % (ref 11.5–15.5)
WBC: 15.5 10*3/uL — ABNORMAL HIGH (ref 4.0–10.5)
nRBC: 0 % (ref 0.0–0.2)

## 2019-07-17 LAB — C-REACTIVE PROTEIN: CRP: 11.2 mg/dL — ABNORMAL HIGH (ref <1.0)

## 2019-07-17 LAB — FIBRIN DERIVATIVES D-DIMER (ARMC ONLY): Fibrin derivatives D-dimer (ARMC): >7500 ng/mL (FEU) — ABNORMAL HIGH (ref 0.00–499.00)

## 2019-07-17 LAB — GLUCOSE, CAPILLARY
Glucose-Capillary: 126 mg/dL — ABNORMAL HIGH (ref 70–99)
Glucose-Capillary: 270 mg/dL — ABNORMAL HIGH (ref 70–99)
Glucose-Capillary: 132 mg/dL — ABNORMAL HIGH (ref 70–99)
Glucose-Capillary: 261 mg/dL — ABNORMAL HIGH (ref 70–99)

## 2019-07-17 LAB — HEPARIN LEVEL (UNFRACTIONATED): Heparin Unfractionated: 0.44 IU/mL (ref 0.30–0.70)

## 2019-07-17 LAB — MAGNESIUM: Magnesium: 1.5 mg/dL — ABNORMAL LOW (ref 1.7–2.4)

## 2019-07-17 SURGERY — PERIPHERAL VASCULAR THROMBECTOMY
Anesthesia: Moderate Sedation | Laterality: Left

## 2019-07-17 MED ORDER — MIDAZOLAM HCL 5 MG/5ML IJ SOLN
INTRAMUSCULAR | Status: AC
  Filled 2019-07-17: qty 5

## 2019-07-17 MED ORDER — CEFAZOLIN SODIUM-DEXTROSE 1-4 GM/50ML-% IV SOLN: 1.0000 g | Freq: Once | INTRAVENOUS | Status: DC

## 2019-07-17 MED ORDER — HEPARIN SODIUM (PORCINE) 1000 UNIT/ML IJ SOLN
INTRAMUSCULAR | Status: DC | PRN
  Administered 2019-07-17: 3000 [IU] via INTRAVENOUS

## 2019-07-17 MED ORDER — MAGNESIUM SULFATE 2 GM/50ML IV SOLN
2.0000 g | Freq: Once | INTRAVENOUS | Status: AC
  Administered 2019-07-17: 09:00:00 2 g via INTRAVENOUS
  Filled 2019-07-17: qty 50

## 2019-07-17 MED ORDER — SODIUM CHLORIDE 0.9 % IV SOLN: INTRAVENOUS | Status: DC

## 2019-07-17 MED ORDER — COLLAGENASE 250 UNIT/GM EX OINT
TOPICAL_OINTMENT | Freq: Every day | CUTANEOUS | Status: DC
  Filled 2019-07-17 (×2): qty 30

## 2019-07-17 MED ORDER — DIPHENHYDRAMINE HCL 50 MG/ML IJ SOLN: 50.0000 mg | Freq: Once | INTRAMUSCULAR | Status: DC | PRN

## 2019-07-17 MED ORDER — MIDAZOLAM HCL 2 MG/2ML IJ SOLN
INTRAMUSCULAR | Status: DC | PRN
  Administered 2019-07-17: 2 mg via INTRAVENOUS

## 2019-07-17 MED ORDER — HYDROMORPHONE HCL 1 MG/ML IJ SOLN: 1.0000 mg | Freq: Once | INTRAMUSCULAR | Status: DC | PRN

## 2019-07-17 MED ORDER — FAMOTIDINE 20 MG PO TABS: 40.0000 mg | ORAL_TABLET | Freq: Once | ORAL | Status: DC | PRN

## 2019-07-17 MED ORDER — ONDANSETRON HCL 4 MG/2ML IJ SOLN: 4.0000 mg | Freq: Four times a day (QID) | INTRAMUSCULAR | Status: DC | PRN

## 2019-07-17 MED ORDER — HEPARIN SODIUM (PORCINE) 1000 UNIT/ML IJ SOLN
INTRAMUSCULAR | Status: AC
  Filled 2019-07-17: qty 1

## 2019-07-17 MED ORDER — ALTEPLASE 2 MG IJ SOLR
INTRAMUSCULAR | Status: DC | PRN
  Administered 2019-07-17: 6 mg

## 2019-07-17 MED ORDER — MIDAZOLAM HCL 2 MG/ML PO SYRP
8.0000 mg | ORAL_SOLUTION | Freq: Once | ORAL | Status: DC | PRN
  Filled 2019-07-17: qty 4

## 2019-07-17 MED ORDER — CEFAZOLIN SODIUM-DEXTROSE 1-4 GM/50ML-% IV SOLN
1.0000 g | Freq: Once | INTRAVENOUS | Status: DC
  Filled 2019-07-17: qty 50

## 2019-07-17 MED ORDER — FENTANYL CITRATE (PF) 100 MCG/2ML IJ SOLN
INTRAMUSCULAR | Status: DC | PRN
  Administered 2019-07-17: 50 ug via INTRAVENOUS

## 2019-07-17 MED ORDER — FENTANYL CITRATE (PF) 100 MCG/2ML IJ SOLN
INTRAMUSCULAR | Status: AC
  Filled 2019-07-17: qty 2

## 2019-07-17 MED ORDER — CEFAZOLIN SODIUM-DEXTROSE 1-4 GM/50ML-% IV SOLN
INTRAVENOUS | Status: AC
  Administered 2019-07-17: 1 g
  Filled 2019-07-17: qty 50

## 2019-07-17 MED ORDER — IODIXANOL 320 MG/ML IV SOLN
INTRAVENOUS | Status: DC | PRN
  Administered 2019-07-17: 60 mL via INTRAVENOUS

## 2019-07-17 MED ORDER — METHYLPREDNISOLONE SODIUM SUCC 125 MG IJ SOLR: 125.0000 mg | Freq: Once | INTRAMUSCULAR | Status: DC | PRN

## 2019-07-17 SURGICAL SUPPLY — 12 items
BALLN ULTRVRSE 12X80X75 (BALLOONS) ×3
BALLOON ULTRVRSE 12X80X75 (BALLOONS) IMPLANT
CANISTER PENUMBRA ENGINE (MISCELLANEOUS) ×2 IMPLANT
CATH INDIGO 12XTORQ 100 (CATHETERS) ×2 IMPLANT
COVER PROBE U/S 5X48 (MISCELLANEOUS) ×2 IMPLANT
DEVICE PRESTO INFLATION (MISCELLANEOUS) ×2 IMPLANT
KIT FEMORAL DEL DENALI (Miscellaneous) IMPLANT
PACK ANGIOGRAPHY (CUSTOM PROCEDURE TRAY) ×3 IMPLANT
SHEATH PINNACLE 11FRX10 (SHEATH) ×2 IMPLANT
STENT VENOVO 16X60X80 (Permanent Stent) ×2 IMPLANT
WIRE J 3MM .035X145CM (WIRE) ×3 IMPLANT
WIRE MAGIC TOR.035 180C (WIRE) ×4 IMPLANT

## 2019-07-17 NOTE — Evaluation (Signed)
Occupational Therapy Evaluation Patient Details Name: Anne George MRN: [id number] DOB: 1945/10/20 Today's Date: 07/17/2019    History of Present Illness Pt is a 74 y.o. female presenting to hospital 07/14/19 with generalized weakness; fever and cough about 2 weeks ago; tested (+) for COVID-19 January 13th; unable to walk past couple days.  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  Plan for thrombectomy d/t extensive L LE DVT.  PMH includes DM, colostomy, CKD stage IV, chronic lymphedema, htn, anemia of chronic disease, chronic venous stasis.   Clinical Impression   Pt was seen for OT evaluation this date. Prior to hospital admission, pt was living by herself and had her neighbors and sons who assisted with groceries/meals. Pt ambulates with a SPC and was able to perform ADL without assist. Currently pt demonstrates impairments as described below (See OT problem list) which functionally limit her ability to perform ADL/self-care tasks. Pt currently requires Max A for LB ADL tasks, +2 for OOB ADL mobility. Max-total assist for scooting up in bed for improved positioning for LB pain and pillow placed under BLE to improve comfort. VSS throughout on RA. Pt instructed in incentive spirometer and Acapella flutter valve use, pt able to return demo x5 each with no drop in O2 sats and denied SOB. Pt instructed in optimal set up for showering at home to minimize falls risk using shower chair vs stool with added benefits of back rest on shower chair for activity tolerance and safety. Pt verbalized understanding. Pt would benefit from skilled OT to address noted impairments and functional limitations (see below for any additional details) in order to maximize safety and independence while minimizing falls risk and caregiver burden. Upon hospital discharge, recommend STR to maximize pt safety and return to PLOF.     Follow Up  Recommendations  SNF    Equipment Recommendations  3 in 1 bedside commode    Recommendations for Other Services       Precautions / Restrictions Precautions Precautions: Fall Precaution Comments: IV Heparin; colostomy Restrictions Weight Bearing Restrictions: No      Mobility Bed Mobility Overal bed mobility: Needs Assistance             General bed mobility comments: max-total assist to scoot up in bed  Transfers                      Balance                                           ADL either performed or assessed with clinical judgement   ADL Overall ADL's : Needs assistance/impaired                                       General ADL Comments: Currently requires Max A for all LB ADL, +2 for toilet transfers to [place] Patient Visual Report: No change from baseline       Perception     Praxis      Pertinent Vitals/Pain Pain Assessment: 0-10 Pain Score: 5  Pain Location: bilat LE's from una boots prior to repositioning Pain Descriptors / Indicators: Aching;Grimacing Pain Intervention(s): Limited activity within patient's tolerance;Monitored  during session;Repositioned     Hand Dominance     Extremity/Trunk Assessment Upper Extremity Assessment Upper Extremity Assessment: Generalized weakness   Lower Extremity Assessment Lower Extremity Assessment: Generalized weakness       Communication Communication Communication: No difficulties   Cognition Arousal/Alertness: Awake/alert Behavior During Therapy: WFL for tasks assessed/performed Overall Cognitive Status: Within Functional Limits for tasks assessed                                     General Comments       Exercises Other Exercises Other Exercises: Pt instructed in incentive spirometer and Acapella flutter valve use, pt able to return demo x5 each with no drop in O2 sats and denied SOB Other Exercises: Pillow placed  underneath BLE with pt reporting improved comfort Other Exercises: Pt instructed in optimal set up for showering at home to minimize falls risk   Shoulder Instructions      Home Living Family/patient expects to be discharged to:: Private residence Living Arrangements: Alone Available Help at Discharge: Family Type of Home: Mobile home Home Access: Stairs to enter Technical brewer of Steps: 4-5 Entrance Stairs-Rails: Right;Left;Can reach both       Bathroom Shower/Tub: Occupational psychologist: Standard     Home Equipment: Environmental consultant - 2 wheels;Cane - single point;Shower seat   Additional Comments: Per chart pt can stay with son and son's wife, has shower chair and shower stool      Prior Functioning/Environment Level of Independence: Independent with assistive device(s)        Comments: Ambulates with cane        OT Problem List: Decreased strength;Decreased activity tolerance;Impaired balance (sitting and/or standing);Decreased knowledge of use of DME or AE      OT Treatment/Interventions: Self-care/ADL training;Therapeutic exercise;DME and/or AE instruction;Energy conservation;Patient/family education;Balance training;Therapeutic activities    OT Goals(Current goals can be found in the care plan section) Acute Rehab OT Goals Patient Stated Goal: to get stronger OT Goal Formulation: With patient Time For Goal Achievement: 07/31/19 Potential to Achieve Goals: Good ADL Goals Pt Will Perform Grooming: sitting;with min guard assist;with set-up(sitting EOB, CGA for sitting balance) Pt Will Perform Lower Body Dressing: with mod assist;sit to/from stand;with adaptive equipment Pt Will Transfer to Toilet: bedside commode;stand pivot transfer;with min assist;with +2 assist(LRAD)  OT Frequency: Min 1X/week   Barriers to D/C:            Co-evaluation              AM-PAC OT "6 Clicks" Daily Activity     Outcome Measure Help from another person eating  meals?: None Help from another person taking care of personal grooming?: None Help from another person toileting, which includes using toliet, bedpan, or urinal?: A Lot Help from another person bathing (including washing, rinsing, drying)?: A Lot Help from another person to put on and taking off regular upper body clothing?: A Lot Help from another person to put on and taking off regular lower body clothing?: A Lot 6 Click Score: 16   End of Session    Activity Tolerance: Patient tolerated treatment well Patient left: in bed;with call bell/phone within reach;with bed alarm set  OT Visit Diagnosis: Other abnormalities of gait and mobility (R26.89);Muscle weakness (generalized) (M62.81)                Time: [id number] OT Time Calculation (min): 23 min Charges:  OT General Charges $OT Visit: 1 Visit OT Evaluation $OT Eval Moderate Complexity: 1 Mod OT Treatments $Therapeutic Activity: 8-22 mins  Jeni Salles, MPH, MS, OTR/L ascom [phone] 07/17/19, 12:25 PM

## 2019-07-17 NOTE — Op Note (Signed)
[facility]   OPERATIVE NOTE   PRE-OPERATIVE DIAGNOSIS: extensive left lower extremity DVT  POST-OPERATIVE DIAGNOSIS: same   PROCEDURE: 1. US guidance for vascular access to left popliteal vein 2. Catheter placement into left common iliac vein from left popliteal approach 3. IVC gram and left lower extremity venogram 4.   Catheter directed thrombolysis with 6 mg of TPA to the left popliteal and femoral vein  5. Mechanical thrombectomy to the left popliteal, superficial femoral, common femoral, and iliac veins with the penumbra cat 12 device 6. PTA of left iliac veins with 12 mm diameter balloon 7. Stent placement to the left external iliac vein with 16 mm diameter by 6 cm length stent   SURGEON: Leotis Pain, MD  ASSISTANT(S): none  ANESTHESIA: local with moderate conscious sedation for 30 minutes using 2 mg of Versed and 50 mcg of Fentanyl  ESTIMATED BLOOD LOSS: 100 cc  FINDING(S): 1. Extensive left lower extremity DVT extending all the way up to the iliac veins where there was a left external iliac vein high-grade stenosis in the 80-90% range.  SPECIMEN(S): none  INDICATIONS:  Patient is a 74 y.o. female who presents with leg swelling and pain and an extensive left lower extremity. Patient has marked leg swelling and pain. Venous intervention is performed to reduce the symtpoms and avoid long term postphlebitic symptoms.   DESCRIPTION: After obtaining full informed written consent, the patient was brought back to the vascular suite and placed supine upon the table.Moderate conscious sedation was administered during a face to face encounter with the patient throughout the procedure with my supervision of the RN administering medicines and monitoring the patient's vital signs, pulse oximetry, telemetry and mental status throughout from the start of the procedure until the patient was taken to the recovery room. After obtaining adequate  anesthesia, the patient was prepped and draped in the standard fashion. The patient was then placed into the prone position. The left popliteal vein was then accessed under direct ultrasound guidance without difficulty with a micropuncture needle and a permanent image was recorded. I then upsized to an 11Fr sheath over a J wire. 3000 units of heparin were then given. Imaging showed extensive DVT with minimal flow. A Kumpe catheter and Magic tourque wire were then advanced into the CFV and distal external iliac vein and images were performed. There was a high-grade stenosis in the iliac vein particularly the external iliac vein which appeared to be in the 80 to 90% range. I was able to cross the thrombus and stenosis and advance into the IVC which was patent. I then used the penumbra cat 12 catheter and instilled 6 of tpa throughout the popliteal and femoral veins.  After this dwelled, I used the Penumbra Cat 12 catheter and evacuated about 100cc of effluent with mechanical thrombectomy throughout the iliac veins, CFV, SFV, and a popliteal. This had worked improvement. The thrombus from the popliteal vein just above the access site all the way up through the superficial femoral vein, and then through the common femoral vein were resolved.  The iliac really had no residual thrombus but there was a high-grade stenosis in the iliac veins.  I used a 12 mm diameter by 8 cm length angioplasty balloon in the left external and common iliac veins and inflated this to 8 atm for 1 minute.  Completion imaging showed residual stenosis in the mid to distal left external iliac vein in the 60 to 70% range with the common iliac  vein being widely patent.  I elected to place a stent in the left external iliac vein.  A 16 mm diameter by 6 cm length VeNovo stent was then deployed in the left external iliac vein and postdilated with a 12 mm balloon with excellent angiographic completion result and less than 10% residual  stenosis.  I then elected to terminate the procedure. The sheath was removed and a dressing was placed. She was taken to the recovery room in stable condition having tolerated the procedure well.   COMPLICATIONS: None  CONDITION: Stable  Leotis Pain 07/17/2019 3:14 PM

## 2019-07-17 NOTE — H&P (Signed)
[facility] History & Physical Update  The patient was interviewed and re-examined.  The patient's previous History and Physical has been reviewed and is unchanged.  There is no change in the plan of care. We plan to proceed with the scheduled procedure.  Leotis Pain, MD  07/17/2019, 1:49 PM

## 2019-07-17 NOTE — Care Management Important Message (Signed)
Important Message  Patient Details  Name: Anne George MRN: [id number] Date of Birth: 1946-03-08   Medicare Important Message Given:  Yes     Shelbie Hutching, RN 07/17/2019, 4:13 PM

## 2019-07-17 NOTE — Progress Notes (Signed)
PT Cancellation Note  Patient Details Name: Anne George MRN: [id number] DOB: 1945/08/03   Cancelled Treatment:    Reason Eval/Treat Not Completed: Patient at procedure or test/unavailable.  Pt currently off unit for procedure.  Will re-attempt PT treatment session at a later date/time.   Leitha Bleak, PT 07/17/19, 1:14 PM

## 2019-07-17 NOTE — Consult Note (Signed)
ANTICOAGULATION CONSULT NOTE - Initial Consult  Pharmacy Consult for Heparin Drip Indication: VTE treatment  No Known Allergies  Patient Measurements: Height: 4\' 11"  (149.9 cm) Weight: 169 lb (76.7 kg) IBW/kg (Calculated) : 43.2 Heparin Dosing Weight: 60.8kg  Vital Signs: Temp: 97.5 F (36.4 C) (01/27 2340) Temp Source: Oral (01/27 2340) BP: 117/47 (01/27 1900) Pulse Rate: 73 (01/27 2300)  Labs: Recent Labs    07/14/19 1222 07/14/19 1529 07/14/19 1529 07/14/19 2133 07/15/19 0426 07/15/19 0426 07/15/19 1004 07/15/19 1809 07/16/19 0307 07/16/19 0307 07/16/19 0310 07/16/19 1300 07/16/19 2224 07/17/19 0246  HGB   < >  --   --   --  12.7   < >  --   --   --   --  11.8*  --   --  10.8*  HCT   < >  --   --   --  38.9  --   --   --   --   --  36.6  --   --  33.9*  PLT   < >  --   --   --  320  --   --   --   --   --  288  --   --  265  APTT  --   --   --  >160*  --   --   --   --   --   --   --   --   --   --   LABPROT  --   --   --  17.0*  --   --   --   --   --   --   --   --   --   --   INR  --   --   --  1.4*  --   --   --   --   --   --   --   --   --   --   HEPARINUNFRC  --   --   --   --  2.68*   < > 1.21*   < > 1.37*   < >  --  0.78* 0.34 0.44  CREATININE   < > 3.54*   < > 3.28* 2.91*   < > 2.86*  --  2.38*  --   --   --   --  2.11*  TROPONINIHS  --  14  --  25*  --   --   --   --   --   --   --   --   --   --    < > = values in this interval not displayed.    Estimated Creatinine Clearance: 21.5 mL/min (A) (by C-G formula based on SCr of 2.11 mg/dL (H)).  Medications:  No pta anticoagulant of record  Assessment: Anne George is a 74 y.o. female with past medical history of diabetes, good fluid removal, colostomy, and recent history of COVID-19 infection presents to the ED complaining of generalized weakness.  Pharmacy has been consulted to initiate and monitor heparin drip for VTE treatment.                          HL 01/26 0426  2.68 (@950   units/hr) 01/26 1004      1.21 (@ 750 units/hr) 01/26 1809    <0.10 (@ 600 units/hr) 01/27 0307      1.37 (@  750 units/hr)  supratherapeutic- rate reduced to (650 units/hr)   Goal of Therapy:  Heparin level 0.3-0.7 units/ml Monitor platelets by anticoagulation protocol: Yes   Plan:  01/28 @ 0240 HL 0.44 therapeutic. Will continue current rate and will recheck HL w/ am labs and will continue to monitor.  Tobie Lords, PharmD, BCPS Clinical Pharmacist 07/17/2019 4:53 AM

## 2019-07-17 NOTE — Consult Note (Addendum)
WOC Nurse Consult Note: Reason for Consult: Unstageable pressure injury to coccyx and left buttocks.  Patient was found "down" after being unable to get up for two days. Has ileostomy but was incontinent of urine.  Wound type: Unstageable pressure injuries to sacrococcygeal area and left buttocks.   Pressure Injury POA: Yes Measurement: sacrum:  3 cm x 2.4 cm slough to wound bed Left buttocks:  2 cm x 2 cm slough to wound bed Wound bed:100% thin slough Drainage (amount, consistency, odor) minimal serosanguinous  Necrotic odor Periwound: intact Dressing procedure/placement/frequency: Cleanse wounds to sacrum and buttocks with NS and pat dry. APPLY Santyl to wound bed  Cover with NS moist gauze and top with dry gauze and ABD pad/tape.  Change daily.  [place] Nurse ostomy consult note Stoma type/location: RLQ ileostomy in a crease near umbilicus Stomal assessment/size: 1" pink and moist Peristomal assessment: pouch intact  Treatment options for stomal/peristomal skin: not assessed Output liquid  brown stool Ostomy pouching: 2pc. 2 3/4" pouch  Education provided: none Enrolled patient in [place] program: No .Will not follow at this time.  Please re-consult if needed.  Domenic Moras MSN, RN, FNP-BC CWON Wound, Ostomy, Continence Nurse Pager [phone]

## 2019-07-17 NOTE — Progress Notes (Addendum)
Per Dr. Lucky Cowboy to restart heparin gtt at 4 pm same rate no bolus. Also small black bugs found in Patient hair during procedure. Updated recovery  Verita Lamb in report.

## 2019-07-17 NOTE — Progress Notes (Addendum)
[facility] Progress Note  Patient: Anne George    [id number]  DOA: 07/14/2019     Date of Service: the patient was seen and examined on 07/17/2019  Chief Complaint  Patient presents with  . Weakness   Brief hospital course: Type I DM, CKD stage IV, chronic lymphedema, rectal cancer SP colostomy, anemia of chronic disease, HTN, chronic venous stasis.  Patient presented with complaints of generalized fatigue and weakness.  She also has poor living conditions reported by EMS.  Had acute kidney injury on chronic kidney disease along with DVT in COVID-19 infection. Currently further plan is continue.  Assessment and Plan: 1.  DKA Type 1 diabetes mellitus with chronic insulin use and chronic kidney disease as well as diabetic retinopathy. Patient actually had anion gap of 18 at the time of admission. Also had acute on chronic kidney disease with severe metabolic acidosis. Blood glucose was 645 and bicarb of 19. Patient was started on IV insulin with Endo tool and IV fluids. Anion gap closed and currently glucose are well controlled. Appreciate diabetic educator. Patient has Longstanding T1 DM since age 74. Hemoglobin A1c 8.4 this admission, 7.6 prior to that. Continue basal bolus sliding scale regimen.  2.  Acute kidney injury on chronic kidney disease stage IV. Combination of progression of the renal disease as well as dehydration and poor p.o. intake. Continuing on IV fluids with improvement in renal function. Baseline renal function around 2.5-3.0. Monitor.  3.  COVID-19 infection.  Diagnosed 07/02/2019 never treated. Currently on room air. Significant elevated D-dimer and CRP. Chest x-ray also negative for any acute infiltrate. Monitor for now.  4.  Acute left lower leg DVT Patient is swelling of the leg with pain. Doppler is positive. Suspect in the setting of limited mobility due to generalized weakness and dehydration as well as acute COVID-19  illness. Currently on therapeutic heparin. Vascular surgery consulted appreciated their assistance  -Mechanical thrombectomy to the left popliteal, superficial femoral,common femoral, and iliac veins with the penumbra cat 12 device -PTA of left iliac veins with 12 mm diameter balloon -Stent placement to the left external iliac vein with 16 mm diameter by 6 cm length stent  5.  Essential hypertension Continue current regimen.  6.  Diabetic neuropathy Continue Neurontin.  7.  Obesity Body mass index is 33.44 kg/m.  Outpatient dietary consultation.  8.  Social/poor living condition. Social worker consulted. PT also consulted.  9.  Pressure ulcer left buttock, right sacrum unstageable.  POA. Continue foam dressing and frequent turning Pressure Injury 07/14/19 Buttocks Left Unstageable - Full thickness tissue loss in which the base of the injury is covered by slough (yellow, tan, gray, green or brown) and/or eschar (tan, brown or black) in the wound bed. (Active)  07/14/19 1900  Location: Buttocks  Location Orientation: Left  Staging: Unstageable - Full thickness tissue loss in which the base of the injury is covered by slough (yellow, tan, gray, green or brown) and/or eschar (tan, brown or black) in the wound bed.  Wound Description (Comments):   Present on Admission: Yes     Pressure Injury 07/14/19 Sacrum Right Unstageable - Full thickness tissue loss in which the base of the injury is covered by slough (yellow, tan, gray, green or brown) and/or eschar (tan, brown or black) in the wound bed. (Active)  07/14/19 1900  Location: Sacrum  Location Orientation: Right  Staging: Unstageable - Full thickness tissue loss in which the base of the injury is covered by slough (  yellow, tan, gray, green or brown) and/or eschar (tan, brown or black) in the wound bed.  Wound Description (Comments):   Present on Admission: Yes   Diet: Carb modified diet DVT Prophylaxis: Therapeutic  Anticoagulation with Heparin   Advance goals of care discussion: Full code  Family Communication: no family was present at bedside, at the time of interview.   Disposition:  Pt is from home, lives alone, admitted with generalized fatigue fall, DVT, poor living condition and acute kidney injury on chronic kidney disease stage IV, still has worsening renal function as well as ongoing DVT which requires intervention when safe, which precludes a safe discharge. Discharge to SNF.   Subjective: Pain in theleg still present, no acute complains, has dry cough.   Physical Exam: General: alert and oriented to time, place, and person. Appear in mild distress, affect appropriate Eyes: PERRL, Conjunctiva normal ENT: Oral Mucosa Clear, moist  Neck: no JVD, no Abnormal Mass Or lumps Cardiovascular: S1 and S2 Present, no Murmur, peripheral pulses symmetrical Respiratory: good respiratory effort, Bilateral Air entry equal and Decreased, no signs of accessory muscle use, basal Crackles, no wheezes Abdomen: Bowel Sound present, Soft and no tenderness, no hernia Skin: no rashes  Extremities: bilateral  Pedal edema, no calf tenderness Neurologic: without any new focal findings Gait not checked due to patient safety concerns  Vitals:   07/17/19 1515 07/17/19 1525 07/17/19 1530 07/17/19 1550  BP: (!) 135/51 130/72  (!) 135/41  Pulse: 75 74 77 73  Resp: 17 17 16    Temp:      TempSrc:      SpO2: 99% 98% 98% 100%  Weight:      Height:        Intake/Output Summary (Last 24 hours) at 07/17/2019 1601 Last data filed at 07/16/2019 1716 Gross per 24 hour  Intake 2853.81 ml  Output 750 ml  Net 2103.81 ml   Filed Weights   07/14/19 1219 07/17/19 1225  Weight: 76.7 kg 72.6 kg    Data Reviewed: I have personally reviewed and interpreted daily labs, tele strips, imagings as discussed above. I reviewed all nursing notes, pharmacy notes, vitals, pertinent old records I have discussed plan of care as  described above with RN and patient/family.  CBC: Recent Labs  Lab 07/14/19 1222 07/15/19 0426 07/16/19 0310 07/17/19 0246  WBC 9.7 14.7* 17.2* 15.5*  NEUTROABS 8.1*  --   --   --   HGB 13.5 12.7 11.8* 10.8*  HCT 44.6 38.9 36.6 33.9*  MCV 93.5 87.0 88.8 89.9  PLT 301 320 288 [id number]   Basic Metabolic Panel: Recent Labs  Lab 07/14/19 2133 07/15/19 0426 07/15/19 1004 07/16/19 0307 07/17/19 0246  NA 133* 135 133* 136 136  K 3.9 3.8 3.1* 4.0 4.1  CL 96* 99 97* 106 103  CO2 21* 23 25 21* 24  GLUCOSE 351* 112* 185* 218* 118*  BUN 95* 92* 88* 75* 67*  CREATININE 3.28* 2.91* 2.86* 2.38* 2.11*  CALCIUM 8.9 8.9 8.7* 8.3* 8.7*  MG  --   --   --   --  1.5*    Studies: PERIPHERAL VASCULAR CATHETERIZATION  Result Date: 07/17/2019 See op note   Scheduled Meds: . amLODipine  2.5 mg Oral Daily  . aspirin EC  81 mg Oral Daily  . atorvastatin  80 mg Oral Daily  . Chlorhexidine Gluconate Cloth  6 each Topical Q0600  . cholecalciferol  5,000 Units Oral Daily  . collagenase   Topical Daily  .  fentaNYL      . heparin      . insulin aspart  0-5 Units Subcutaneous QHS  . insulin aspart  0-9 Units Subcutaneous TID WC  . insulin aspart  5 Units Subcutaneous TID WC  . insulin detemir  22 Units Subcutaneous Daily  . metoprolol succinate  25 mg Oral Daily  . midazolam      . multivitamin with minerals  1 tablet Oral Daily  . mupirocin ointment  1 application Nasal BID  . pantoprazole  40 mg Oral Daily  . sodium bicarbonate  650 mg Oral BID  . venlafaxine XR  75 mg Oral Daily   Continuous Infusions: . sodium chloride 75 mL/hr at 07/17/19 1037  . heparin Stopped (07/17/19 1435)   PRN Meds: acetaminophen **OR** acetaminophen, dextrose, gabapentin, HYDROmorphone (DILAUDID) injection, ondansetron **OR** ondansetron (ZOFRAN) IV, ondansetron (ZOFRAN) IV  Time spent: 35 minutes  Author: Berle Mull, MD [facility] 07/17/2019 4:01 PM  To reach On-call, see care teams to locate  the attending and reach out to them via [email or web address]. If 7PM-7AM, please contact night-coverage If you still have difficulty reaching the attending provider, please page the [facility] (Director on Call) for [facility] on amion for assistance.

## 2019-07-18 ENCOUNTER — Encounter: Payer: Self-pay | Admitting: Cardiology

## 2019-07-18 DIAGNOSIS — L98421 Non-pressure chronic ulcer of back limited to breakdown of skin: Secondary | ICD-10-CM

## 2019-07-18 DIAGNOSIS — N179 Acute kidney failure, unspecified: Secondary | ICD-10-CM

## 2019-07-18 DIAGNOSIS — R739 Hyperglycemia, unspecified: Secondary | ICD-10-CM

## 2019-07-18 LAB — CBC
HCT: 37.8 % (ref 36.0–46.0)
Hemoglobin: 11.6 g/dL — ABNORMAL LOW (ref 12.0–15.0)
MCH: 28.2 pg (ref 26.0–34.0)
MCHC: 30.7 g/dL (ref 30.0–36.0)
MCV: 91.7 fL (ref 80.0–100.0)
Platelets: 321 10*3/uL (ref 150–400)
RBC: 4.12 MIL/uL (ref 3.87–5.11)
RDW: 13 % (ref 11.5–15.5)
WBC: 12.9 10*3/uL — ABNORMAL HIGH (ref 4.0–10.5)
nRBC: 0 % (ref 0.0–0.2)

## 2019-07-18 LAB — GLUCOSE, CAPILLARY
Glucose-Capillary: 159 mg/dL — ABNORMAL HIGH (ref 70–99)
Glucose-Capillary: 293 mg/dL — ABNORMAL HIGH (ref 70–99)
Glucose-Capillary: 169 mg/dL — ABNORMAL HIGH (ref 70–99)
Glucose-Capillary: 199 mg/dL — ABNORMAL HIGH (ref 70–99)

## 2019-07-18 LAB — FIBRIN DERIVATIVES D-DIMER (ARMC ONLY): Fibrin derivatives D-dimer (ARMC): >10000 ng/mL (FEU) — ABNORMAL HIGH (ref 0.00–499.00)

## 2019-07-18 LAB — BASIC METABOLIC PANEL
Anion gap: 8 (ref 5–15)
BUN: 55 mg/dL — ABNORMAL HIGH (ref 8–23)
CO2: 20 mmol/L — ABNORMAL LOW (ref 22–32)
Calcium: 8.7 mg/dL — ABNORMAL LOW (ref 8.9–10.3)
Chloride: 109 mmol/L (ref 98–111)
Creatinine, Ser: 1.87 mg/dL — ABNORMAL HIGH (ref 0.44–1.00)
GFR calc Af Amer: 31 mL/min — ABNORMAL LOW (ref >60)
GFR calc non Af Amer: 26 mL/min — ABNORMAL LOW (ref >60)
Glucose, Bld: 167 mg/dL — ABNORMAL HIGH (ref 70–99)
Potassium: 4.4 mmol/L (ref 3.5–5.1)
Sodium: 137 mmol/L (ref 135–145)

## 2019-07-18 LAB — HEPARIN LEVEL (UNFRACTIONATED): Heparin Unfractionated: 0.31 IU/mL (ref 0.30–0.70)

## 2019-07-18 LAB — MAGNESIUM: Magnesium: 2.1 mg/dL (ref 1.7–2.4)

## 2019-07-18 LAB — C-REACTIVE PROTEIN: CRP: 5.4 mg/dL — ABNORMAL HIGH (ref <1.0)

## 2019-07-18 MED ORDER — APIXABAN 5 MG PO TABS: 5.0000 mg | ORAL_TABLET | Freq: Two times a day (BID) | ORAL | Status: DC

## 2019-07-18 MED ORDER — SACCHAROMYCES BOULARDII 250 MG PO CAPS
250.0000 mg | ORAL_CAPSULE | Freq: Two times a day (BID) | ORAL | Status: DC
  Administered 2019-07-18 – 2019-07-22 (×8): 250 mg via ORAL
  Filled 2019-07-18 (×11): qty 1

## 2019-07-18 MED ORDER — GABAPENTIN 300 MG PO CAPS
300.0000 mg | ORAL_CAPSULE | Freq: Two times a day (BID) | ORAL | Status: DC
  Administered 2019-07-18 – 2019-07-22 (×9): 300 mg via ORAL
  Filled 2019-07-18 (×9): qty 1

## 2019-07-18 MED ORDER — APIXABAN 5 MG PO TABS
10.0000 mg | ORAL_TABLET | Freq: Two times a day (BID) | ORAL | Status: DC
  Administered 2019-07-18 – 2019-07-22 (×9): 10 mg via ORAL
  Filled 2019-07-18 (×10): qty 2

## 2019-07-18 NOTE — Progress Notes (Signed)
Physical Therapy Treatment Patient Details Name: Anne George MRN: [id number] DOB: 1946/06/19 Today's Date: 07/18/2019    History of Present Illness Pt is a 74 y.o. female presenting to hospital 07/14/19 with generalized weakness; fever and cough about 2 weeks ago; tested (+) for COVID-19 January 13th; unable to walk past couple days.  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  S/p 07/17/19 mechanical thrombectomy to L popliteal, superficial femoral, common femoral, and iliac veins; PTA of L iliac veins; stent placement to L external iliac vein.  PMH includes DM, colostomy, CKD stage IV, chronic lymphedema, htn, anemia of chronic disease, chronic venous stasis.    PT Comments    Per secure text with Marcelle Overlie (vascular PA) in regards to any restrictions (s/p mechanical thrombectomy procedure): no restrictions from their standpoint.  When therapist first attempted to see pt, pt's nurse present and reporting pt needing clean up d/t ostomy leaking issue.  Nurse reporting later that pt's ostomy still leaking (waiting for ostomy nurse to come assist) and pt's nurse recommending bed level ex's (no OOB mobility) d/t ostomy leaking issues.  Pt tolerated bed level LE ex's well (performed minimal L knee flexion ROM d/t bandage behind pt's L knee).  Will continue to focus on strengthening and progressive functional mobility during hospitalization.   Follow Up Recommendations  SNF     Equipment Recommendations  Rolling walker with 5" wheels;3in1 (PT)    Recommendations for Other Services OT consult     Precautions / Restrictions Precautions Precautions: Fall Precaution Comments: colostomy; elevate L LE heart level or higher Restrictions Weight Bearing Restrictions: No    Mobility  Bed Mobility               General bed mobility comments: Deferred OOB mobility per discussion with pt's nurse  d/t ostomy leaking  Transfers                    Ambulation/Gait                 Stairs             Wheelchair Mobility    Modified Rankin (Stroke Patients Only)       Balance                                            Cognition Arousal/Alertness: Awake/alert Behavior During Therapy: WFL for tasks assessed/performed Overall Cognitive Status: Within Functional Limits for tasks assessed                                        Exercises General Exercises - Lower Extremity Ankle Circles/Pumps: AROM;Strengthening;Both;Supine(10 reps x2) Quad Sets: AROM;Strengthening;Both;Supine(10 reps x2) Heel Slides: AAROM;Strengthening;Both;Supine(10 reps x2; minimal L knee flexion ROM d/t bandage behind L knee) Hip ABduction/ADduction: AAROM;Strengthening;Both;Supine(10 reps x2) Straight Leg Raises: AAROM;Strengthening;Both;Supine(10 reps x2)    General Comments   Nursing cleared pt for participation (bed level ex's) in physical therapy.  Pt agreeable to PT session.       Pertinent Vitals/Pain Pain Assessment: No/denies pain Pain Intervention(s): Monitored during session;Limited activity within patient's tolerance;Repositioned  Vitals (HR and O2 on room air) stable and WFL throughout treatment session.  Home Living                      Prior Function            PT Goals (current goals can now be found in the care plan section) Acute Rehab PT Goals Patient Stated Goal: to get stronger PT Goal Formulation: With patient Time For Goal Achievement: 07/30/19 Potential to Achieve Goals: Fair Progress towards PT goals: Progressing toward goals    Frequency    Min 2X/week      PT Plan Current plan remains appropriate    Co-evaluation              AM-PAC PT "6 Clicks" Mobility   Outcome Measure  Help needed turning from your back to your side while in a flat bed without using bedrails?: A Lot Help  needed moving from lying on your back to sitting on the side of a flat bed without using bedrails?: A Lot Help needed moving to and from a bed to a chair (including a wheelchair)?: A Lot Help needed standing up from a chair using your arms (e.g., wheelchair or bedside chair)?: A Lot Help needed to walk in hospital room?: Total Help needed climbing 3-5 steps with a railing? : Total 6 Click Score: 10    End of Session   Activity Tolerance: Patient tolerated treatment well Patient left: in bed;with call bell/phone within reach;with bed alarm set;Other (comment)(B LE's elevated and in heel protector boots) Nurse Communication: Precautions PT Visit Diagnosis: Other abnormalities of gait and mobility (R26.89);Muscle weakness (generalized) (M62.81);Difficulty in walking, not elsewhere classified (R26.2)     Time: [id number] PT Time Calculation (min) (ACUTE ONLY): 24 min  Charges:  $Therapeutic Exercise: 23-37 mins                     Leitha Bleak, PT 07/18/19, 4:02 PM

## 2019-07-18 NOTE — Consult Note (Signed)
ANTICOAGULATION CONSULT NOTE - Initial Consult  Pharmacy Consult for Heparin Drip Indication: VTE treatment  No Known Allergies  Patient Measurements: Height: 4\' 10"  (147.3 cm) Weight: 160 lb (72.6 kg) IBW/kg (Calculated) : 40.9 Heparin Dosing Weight: 60.8kg  Vital Signs: Temp: 98.1 F (36.7 C) (01/29 0812) Temp Source: Oral (01/29 0812) BP: 168/45 (01/29 0812) Pulse Rate: 66 (01/29 0812)  Labs: Recent Labs    07/16/19 0307 07/16/19 0310 07/16/19 0310 07/16/19 1300 07/16/19 2224 07/17/19 0246 07/18/19 0454  HGB  --  11.8*   < >  --   --  10.8* 11.6*  HCT  --  36.6  --   --   --  33.9* 37.8  PLT  --  288  --   --   --  265 321  HEPARINUNFRC 1.37*  --   --    < > 0.34 0.44 0.31  CREATININE 2.38*  --   --   --   --  2.11* 1.87*   < > = values in this interval not displayed.    Estimated Creatinine Clearance: 23 mL/min (A) (by C-G formula based on SCr of 1.87 mg/dL (H)).  Medications:  No pta anticoagulant of record  Assessment: Anne George is a 74 y.o. female with past medical history of diabetes, good fluid removal, colostomy, and recent history of COVID-19 infection presents to the ED complaining of generalized weakness.  Pharmacy has been consulted to initiate and monitor heparin drip for VTE treatment.                          HL 01/26 0426  2.68 (@950  units/hr) 01/26 1004      1.21 (@ 750 units/hr) 01/26 1809    <0.10 (@ 600 units/hr) 01/27 0307      1.37 (@ 750 units/hr) 01/28 1300      0.78 (@ 650 units/hr) 01/28 0240      0.44 therapeutic (@600  units/hr) 01/29 0454   0.31 therapeutic    Goal of Therapy:  Heparin level 0.3-0.7 units/ml Monitor platelets by anticoagulation protocol: Yes   Plan:  Will continue current rate (600 units/hr) and will recheck HL w/ am labs and will continue to monitor.  Lu Duffel, PharmD, BCPS Clinical Pharmacist 07/18/2019 9:30 AM

## 2019-07-18 NOTE — Progress Notes (Signed)
[facility] Progress Note  Patient: Anne George    [id number]  DOA: 07/14/2019     Date of Service: the patient was seen and examined on 07/18/2019  Chief Complaint  Patient presents with  . Weakness   Brief hospital course: Type I DM, CKD stage IV, chronic lymphedema, rectal cancer SP colostomy, anemia of chronic disease, HTN, chronic venous stasis.  Patient presented with complaints of generalized fatigue and weakness.  She also has poor living conditions reported by EMS.  Had acute kidney injury on chronic kidney disease along with DVT in COVID-19 infection. Currently further plan is continue.  Assessment and Plan: 1.  DKA Type 1 diabetes mellitus with chronic insulin use and chronic kidney disease as well as diabetic retinopathy. Patient actually had anion gap of 18 at the time of admission. Also had acute on chronic kidney disease with severe metabolic acidosis. Blood glucose was 645 and bicarb of 19. Patient was started on IV insulin with Endo tool and IV fluids. Anion gap closed and currently glucose are well controlled. Appreciate diabetic educator. Patient has Longstanding T1 DM since age 19. Hemoglobin A1c 8.4 this admission, 7.6 prior to that. Continue basal bolus sliding scale regimen.  2.  Acute kidney injury on chronic kidney disease stage IV. Combination of progression of the renal disease as well as dehydration and poor p.o. intake. Continuing on IV fluids with improvement in renal function. Baseline renal function around 2.5-3.0. Monitor.  3.  COVID-19 infection.  Diagnosed 07/02/2019 never treated. Currently on room air. Significant elevated D-dimer and CRP. Chest x-ray also negative for any acute infiltrate. Monitor for now.  4.  Acute left lower leg DVT Patient is swelling of the leg with pain. Doppler is positive. Suspect in the setting of limited mobility due to generalized weakness and dehydration as well as acute COVID-19  illness. Currently on therapeutic heparin. Switched to apixaban after talking with pharmacist.  Vascular surgery consulted appreciated their assistance  -Mechanical thrombectomy to the left popliteal, superficial femoral,common femoral, and iliac veins with the penumbra cat 12 device -PTA of left iliac veins with 12 mm diameter balloon -Stent placement to the left external iliac vein with 16 mm diameter by 6 cm length stent.  5.  Essential hypertension Continue current regimen.  6.  Diabetic neuropathy Continue Neurontin.  7.  Obesity Body mass index is 33.44 kg/m.  Outpatient dietary consultation.  8.  Social/poor living condition. Social worker consulted. PT also consulted.  9.  Pressure ulcer left buttock, right sacrum unstageable.  POA. Continue foam dressing and frequent turning Pressure Injury 07/14/19 Buttocks Right;Medial Unstageable - Full thickness tissue loss in which the base of the injury is covered by slough (yellow, tan, gray, green or brown) and/or eschar (tan, brown or black) in the wound bed. (Active)  07/14/19 1900  Location: Buttocks  Location Orientation: Right;Medial  Staging: Unstageable - Full thickness tissue loss in which the base of the injury is covered by slough (yellow, tan, gray, green or brown) and/or eschar (tan, brown or black) in the wound bed.  Wound Description (Comments):   Present on Admission: Yes     Pressure Injury 07/14/19 Sacrum Mid Unstageable - Full thickness tissue loss in which the base of the injury is covered by slough (yellow, tan, gray, green or brown) and/or eschar (tan, brown or black) in the wound bed. (Active)  07/14/19 1900  Location: Sacrum  Location Orientation: Mid  Staging: Unstageable - Full thickness tissue loss in which the  base of the injury is covered by slough (yellow, tan, gray, green or brown) and/or eschar (tan, brown or black) in the wound bed.  Wound Description (Comments):   Present on Admission: Yes    Diet: Carb modified diet DVT Prophylaxis: Therapeutic Anticoagulation with Heparin   Advance goals of care discussion: Full code  Family Communication: no family was present at bedside, at the time of interview.   Disposition:  Pt is from home, lives alone, admitted with generalized fatigue fall, DVT, poor living condition and acute kidney injury on chronic kidney disease stage IV, still has worsening renal function as well as ongoing DVT which requires intervention when safe, which precludes a safe discharge. Discharge to SNF.   Subjective: Pain in theleg still present, no acute complains, has dry cough.   Physical Exam: General: alert and oriented to time, place, and person. Appear in mild distress, affect appropriate Eyes: PERRL, Conjunctiva normal ENT: Oral Mucosa Clear, moist  Neck: no JVD, no Abnormal Mass Or lumps Cardiovascular: S1 and S2 Present, no Murmur, peripheral pulses symmetrical Respiratory: good respiratory effort, Bilateral Air entry equal and Decreased, no signs of accessory muscle use, basal Crackles, no wheezes Abdomen: Bowel Sound present, Soft and no tenderness, no hernia Skin: no rashes  Extremities: bilateral  Pedal edema, no calf tenderness Neurologic: without any new focal findings Gait not checked due to patient safety concerns  Vitals:   07/18/19 0052 07/18/19 0300 07/18/19 0400 07/18/19 0812  BP:  (!) 131/26 (!) 117/33 (!) 168/45  Pulse:  62 (!) 58 66  Resp:  (!) 9 16 16   Temp:    98.1 F (36.7 C)  TempSrc:    Oral  SpO2: 99% 100% 99% 100%  Weight:      Height:        Intake/Output Summary (Last 24 hours) at 07/18/2019 1740 Last data filed at 07/18/2019 0000 Gross per 24 hour  Intake 393.53 ml  Output 500 ml  Net -106.47 ml   Filed Weights   07/14/19 1219 07/17/19 1225  Weight: 76.7 kg 72.6 kg    Data Reviewed: I have personally reviewed and interpreted daily labs, tele strips, imagings as discussed above. I reviewed all nursing  notes, pharmacy notes, vitals, pertinent old records I have discussed plan of care as described above with RN and patient/family.  CBC: Recent Labs  Lab 07/14/19 1222 07/15/19 0426 07/16/19 0310 07/17/19 0246 07/18/19 0454  WBC 9.7 14.7* 17.2* 15.5* 12.9*  NEUTROABS 8.1*  --   --   --   --   HGB 13.5 12.7 11.8* 10.8* 11.6*  HCT 44.6 38.9 36.6 33.9* 37.8  MCV 93.5 87.0 88.8 89.9 91.7  PLT 301 320 288 265 [id number]   Basic Metabolic Panel: Recent Labs  Lab 07/15/19 0426 07/15/19 1004 07/16/19 0307 07/17/19 0246 07/18/19 0454  NA 135 133* 136 136 137  K 3.8 3.1* 4.0 4.1 4.4  CL 99 97* 106 103 109  CO2 23 25 21* 24 20*  GLUCOSE 112* 185* 218* 118* 167*  BUN 92* 88* 75* 67* 55*  CREATININE 2.91* 2.86* 2.38* 2.11* 1.87*  CALCIUM 8.9 8.7* 8.3* 8.7* 8.7*  MG  --   --   --  1.5* 2.1    Studies: No results found.  Scheduled Meds: . amLODipine  2.5 mg Oral Daily  . apixaban  10 mg Oral BID   Followed by  . [START ON 07/25/2019] apixaban  5 mg Oral BID  . aspirin EC  81  mg Oral Daily  . atorvastatin  80 mg Oral Daily  . Chlorhexidine Gluconate Cloth  6 each Topical Q0600  . cholecalciferol  5,000 Units Oral Daily  . collagenase   Topical Daily  . gabapentin  300 mg Oral BID  . insulin aspart  0-5 Units Subcutaneous QHS  . insulin aspart  0-9 Units Subcutaneous TID WC  . insulin aspart  5 Units Subcutaneous TID WC  . insulin detemir  22 Units Subcutaneous Daily  . metoprolol succinate  25 mg Oral Daily  . multivitamin with minerals  1 tablet Oral Daily  . mupirocin ointment  1 application Nasal BID  . pantoprazole  40 mg Oral Daily  . saccharomyces boulardii  250 mg Oral BID  . sodium bicarbonate  650 mg Oral BID  . venlafaxine XR  75 mg Oral Daily   Continuous Infusions:  PRN Meds: acetaminophen **OR** acetaminophen, dextrose, HYDROmorphone (DILAUDID) injection, ondansetron **OR** ondansetron (ZOFRAN) IV, ondansetron (ZOFRAN) IV  Time spent: 35 minutes  Author: Berle Mull, MD [facility] 07/18/2019 5:40 PM  To reach On-call, see care teams to locate the attending and reach out to them via [email or web address]. If 7PM-7AM, please contact night-coverage If you still have difficulty reaching the attending provider, please page the [facility] (Director on Call) for [facility] on amion for assistance.

## 2019-07-18 NOTE — Progress Notes (Signed)
Inpatient Diabetes Program Recommendations  AACE/ADA: New Consensus Statement on Inpatient Glycemic Control (2015)  Target Ranges:  Prepandial:   less than 140 mg/dL      Peak postprandial:   less than 180 mg/dL (1-2 hours)      Critically ill patients:  140 - 180 mg/dL   Results for TWILLA, ISHIDA (MRN [id number]) as of 07/18/2019 12:29  Ref. Range 07/17/2019 07:54 07/17/2019 11:17 07/17/2019 16:18 07/17/2019 20:12  Glucose-Capillary Latest Ref Range: 70 - 99 mg/dL 126 (H)  6 units NOVOLOG +  22 units LEVEMIR  270 (H)  5 units NOVOLOG  132 (H)  1 unit NOVOLOG  261 (H)  5 units NOVOLOG   Results for RASHAUNA, SPRANDEL (MRN [id number]) as of 07/18/2019 12:29  Ref. Range 07/18/2019 08:07 07/18/2019 11:45  Glucose-Capillary Latest Ref Range: 70 - 99 mg/dL 159 (H)  7 units NOVOLOG +  22 unts LEVEMIR 293 (H)    Home DM Meds: Lantus 28 units QHS       Novolog 2 units for every 15 grams Carbs   Current Orders: Levemir 22 units Daily      Novolog Sensitive Correction Scale/ SSI (0-9 units) TID AC + HS      Novolog 5 units TID with meals      MD- Please consider increasing Novolog Meal Coverage to:   Novolog 8 units TID with meals    --Will follow patient during hospitalization--  Wyn Quaker RN, MSN, CDE Diabetes Coordinator Inpatient Glycemic Control Team Team Pager: [phone] (8a-5p)

## 2019-07-18 NOTE — Consult Note (Addendum)
ANTICOAGULATION CONSULT NOTE - Initial Consult  Pharmacy Consult for Eliquis Indication: VTE treatment  No Known Allergies  Patient Measurements: Height: 4\' 10"  (147.3 cm) Weight: 160 lb (72.6 kg) IBW/kg (Calculated) : 40.9  Vital Signs: Temp: 98.1 F (36.7 C) (01/29 0812) Temp Source: Oral (01/29 0812) BP: 168/45 (01/29 0812) Pulse Rate: 66 (01/29 0812)  Labs: Recent Labs    07/16/19 0307 07/16/19 0310 07/16/19 0310 07/16/19 1300 07/16/19 2224 07/17/19 0246 07/18/19 0454  HGB  --  11.8*   < >  --   --  10.8* 11.6*  HCT  --  36.6  --   --   --  33.9* 37.8  PLT  --  288  --   --   --  265 321  HEPARINUNFRC 1.37*  --   --    < > 0.34 0.44 0.31  CREATININE 2.38*  --   --   --   --  2.11* 1.87*   < > = values in this interval not displayed.    Estimated Creatinine Clearance: 23 mL/min (A) (by C-G formula based on SCr of 1.87 mg/dL (H)).  Medications:  No pta anticoagulant of record  Assessment: Anne George is a 74 y.o. female with past medical history of diabetes, good fluid removal, colostomy, and recent history of COVID-19 infection presents to the ED complaining of generalized weakness.  Pharmacy has been consulted to initiate and monitor Eliquis therapy for VTE treatment following mechanical thrombectomy and stent placement 07/17/19.                        01/26 0426  2.68 (@950  units/hr) 01/26 1004      1.21 (@ 750 units/hr) 01/26 1809    <0.10 (@ 600 units/hr) 01/27 0307      1.37 (@ 750 units/hr) 01/28 1300      0.78 (@ 650 units/hr) 01/28 0240      0.44 therapeutic (@600  units/hr) 01/29 0454   0.31 therapeutic  Heparin history as above   Goal of Therapy:  Monitor platelets by anticoagulation protocol: Yes   Plan:  Will discontinue heparin drip with first dose of Eliquis.  Will start Eliquis 10mg  bid for 7 days, followed by Eliquis 5mg  bid for a minimum of 3 months  Will monitor CBC/Scr a minimum of every 3 days per protocol  Monitor pt for any  signs/symptoms of bleeding  Did not do in person counseling for this pt per current COVID restrictions.  Nurse counseling appreciated since patient is hard of hearing.  Lu Duffel, PharmD, BCPS Clinical Pharmacist 07/18/2019 9:38 AM

## 2019-07-18 NOTE — Consult Note (Addendum)
[facility] Nurse ostomy follow up Pouch is leaking.  Will replace with 1 piece convex and barrier ring Stoma type/location: RLQ ileostomy Stomal assessment/size: 1" pink and moist  flush Peristomal assessment: Skin is pink with folds present at 9 o'clock   Patient states she cares for her pouch at home and sometimes has to change more than daily due to leaks.  She would like staff to care for her ostomy pouch while here.   Treatment options for stomal/peristomal skin: barrier ring and switch to 1 piece convex pouch Output liquid green stool Ostomy pouching: 1pc.convex with barrier ring.  I have cut opening off center due to creasing at umbilicus and increase flat pouching surface.  Pattern left at bedside.  Education provided: none needed Enrolled patient in [place] Start Discharge program: No  She wears coloplast products at home.  [facility] Nurse wound follow up Wound type:DVT to left leg.  Nonremovable dressing to left popliteal leg,left intact   Coban is removed.  Leg is tender to touch.   Measurement:not assessed  Dressing in place  Dressing procedure/placement/frequency: Will keep left leg elevated. Patient verbalizes understanding.   Will not follow at this time.  Please re-consult if needed.  Domenic Moras MSN, RN, FNP-BC CWON Wound, Ostomy, Continence Nurse Pager [phone]

## 2019-07-18 NOTE — Progress Notes (Signed)
[facility] Daily Progress Note   Subjective: 1 Day Post-Op: 1. US guidance for vascular access to left popliteal vein 2. Catheter placement into left common iliac vein from left popliteal approach 3. IVC gram and left lower extremity venogram 4.   Catheter directed thrombolysis with 6 mg of TPA to the left popliteal and femoral vein  5. Mechanical thrombectomy to the left popliteal, superficial femoral, common femoral, and iliac veins with the penumbra cat 12 device 6. PTA of left iliac veins with 12 mm diameter balloon 7. Stent placement to the left external iliac vein with 16 mm diameter by 6 cm length stent  Patient with some continued left lower extremity discomfort. No issues overnight.   Objective: Vitals:   07/18/19 0052 07/18/19 0300 07/18/19 0400 07/18/19 0812  BP:  (!) 131/26 (!) 117/33 (!) 168/45  Pulse:  62 (!) 58 66  Resp:  (!) 9 16 16   Temp:    98.1 F (36.7 C)  TempSrc:    Oral  SpO2: 99% 100% 99% 100%  Weight:      Height:        Intake/Output Summary (Last 24 hours) at 07/18/2019 1057 Last data filed at 07/18/2019 0000 Gross per 24 hour  Intake 551.81 ml  Output 500 ml  Net 51.81 ml   Physical Exam: A&Ox3, NAD CV: RRR Pulmonary: CTA Bilaterally Abdomen: Soft, Nontender, Nondistended Vascular:  Left Lower Extremity: Thigh soft.  Calf soft.  Extremity tender to palpation.  Removed compression dressing.  Minimal edema.  Motor/sensory is intact.   Laboratory: CBC    Component Value Date/Time   WBC 12.9 (H) 07/18/2019 0454   HGB 11.6 (L) 07/18/2019 0454   HCT 37.8 07/18/2019 0454   PLT 321 07/18/2019 0454   BMET    Component Value Date/Time   NA 137 07/18/2019 0454   K 4.4 07/18/2019 0454   CL 109 07/18/2019 0454   CO2 20 (L) 07/18/2019 0454   GLUCOSE 167 (H) 07/18/2019 0454   BUN 55 (H) 07/18/2019 0454   CREATININE 1.87 (H) 07/18/2019 0454   CALCIUM 8.7 (L) 07/18/2019 0454   GFRNONAA 26 (L) 07/18/2019 0454    GFRAA 31 (L) 07/18/2019 0454   Assessment/Planning: The patient is a 74 year old female who presented with an extensive left lower extremity DVT status post endovascular thrombolysis / thrombectomy - POD#1 1) left lower extremity compression dressing removed 2) patient to continue to elevate her left lower extremity heart level or higher 3) agree with transition to aspirin and Eliquis 4) we will see the patient in the outpatient setting for continued monitoring  Discussed with Dr. Ellis Parents Conrad Zajkowski PA-C 07/18/2019 10:57 AM

## 2019-07-18 NOTE — Progress Notes (Signed)
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 Had to call wound care nurse 3x and the last time, the wound care nurse had already left for the day.

## 2019-07-19 LAB — BASIC METABOLIC PANEL
Anion gap: 8 (ref 5–15)
BUN: 52 mg/dL — ABNORMAL HIGH (ref 8–23)
CO2: 19 mmol/L — ABNORMAL LOW (ref 22–32)
Calcium: 8.9 mg/dL (ref 8.9–10.3)
Chloride: 107 mmol/L (ref 98–111)
Creatinine, Ser: 1.97 mg/dL — ABNORMAL HIGH (ref 0.44–1.00)
GFR calc Af Amer: 29 mL/min — ABNORMAL LOW (ref >60)
GFR calc non Af Amer: 25 mL/min — ABNORMAL LOW (ref >60)
Glucose, Bld: 136 mg/dL — ABNORMAL HIGH (ref 70–99)
Potassium: 4.7 mmol/L (ref 3.5–5.1)
Sodium: 134 mmol/L — ABNORMAL LOW (ref 135–145)

## 2019-07-19 LAB — CBC
HCT: 35 % — ABNORMAL LOW (ref 36.0–46.0)
Hemoglobin: 10.9 g/dL — ABNORMAL LOW (ref 12.0–15.0)
MCH: 28.2 pg (ref 26.0–34.0)
MCHC: 31.1 g/dL (ref 30.0–36.0)
MCV: 90.7 fL (ref 80.0–100.0)
Platelets: 310 10*3/uL (ref 150–400)
RBC: 3.86 MIL/uL — ABNORMAL LOW (ref 3.87–5.11)
RDW: 13.1 % (ref 11.5–15.5)
WBC: 12.6 10*3/uL — ABNORMAL HIGH (ref 4.0–10.5)
nRBC: 0 % (ref 0.0–0.2)

## 2019-07-19 LAB — C-REACTIVE PROTEIN: CRP: 3.3 mg/dL — ABNORMAL HIGH (ref <1.0)

## 2019-07-19 LAB — GLUCOSE, CAPILLARY
Glucose-Capillary: 130 mg/dL — ABNORMAL HIGH (ref 70–99)
Glucose-Capillary: 341 mg/dL — ABNORMAL HIGH (ref 70–99)
Glucose-Capillary: 187 mg/dL — ABNORMAL HIGH (ref 70–99)
Glucose-Capillary: 190 mg/dL — ABNORMAL HIGH (ref 70–99)

## 2019-07-19 LAB — FIBRIN DERIVATIVES D-DIMER (ARMC ONLY): Fibrin derivatives D-dimer (ARMC): >7500 ng/mL (FEU) — ABNORMAL HIGH (ref 0.00–499.00)

## 2019-07-19 LAB — MAGNESIUM: Magnesium: 1.7 mg/dL (ref 1.7–2.4)

## 2019-07-19 MED ORDER — PERMETHRIN 5 % EX CREA
TOPICAL_CREAM | Freq: Once | CUTANEOUS | Status: DC
  Filled 2019-07-19: qty 60

## 2019-07-19 NOTE — Progress Notes (Signed)
[facility] Progress Note  Patient: Anne George    [id number]  DOA: 07/14/2019     Date of Service: the patient was seen and examined on 07/19/2019  Chief Complaint  Patient presents with  . Weakness   Brief hospital course: Type I DM, CKD stage IV, chronic lymphedema, rectal cancer SP colostomy, anemia of chronic disease, HTN, chronic venous stasis.  Patient presented with complaints of generalized fatigue and weakness.  She also has poor living conditions reported by EMS.  Had acute kidney injury on chronic kidney disease along with DVT in COVID-19 infection. Currently further plan is continue.  Assessment and Plan: 1.  DKA Type 1 diabetes mellitus with chronic insulin use and chronic kidney disease as well as diabetic retinopathy. Patient actually had anion gap of 18 at the time of admission. Also had acute on chronic kidney disease with severe metabolic acidosis. Blood glucose was 645 and bicarb of 19. Patient was started on IV insulin with Endo tool and IV fluids. Anion gap closed and currently glucose are well controlled. Appreciate diabetic educator. Patient has Longstanding T1 DM since age 74. Hemoglobin A1c 8.4 this admission, 7.6 prior to that. Continue basal bolus sliding scale regimen.  2.  Acute kidney injury on chronic kidney disease stage IV. Combination of progression of the renal disease as well as dehydration and poor p.o. intake. Continuing on IV fluids with improvement in renal function. Baseline renal function around 2.5-3.0. Monitor.  3.  COVID-19 infection.  Diagnosed 07/02/2019 never treated. Currently on room air. Significant elevated D-dimer and CRP. Chest x-ray also negative for any acute infiltrate. Monitor for now.  4.  Acute left lower leg DVT Patient is swelling of the leg with pain. Doppler is positive. Suspect in the setting of limited mobility due to generalized weakness and dehydration as well as acute COVID-19 illness. Patient  was started on therapeutic heparin. Initial plan was to transition to Coumadin and bridged with heparin given the patient's chronic kidney disease. But patient's renal function improved significantly with IV hydration and remains stable despite holding IV hydration and therefore after discussion with the pharmacy patient was transitioned to Eliquis for DVT treatment.  Vascular surgery consulted appreciated their assistance  -Mechanical thrombectomy to the left popliteal, superficial femoral,common femoral, and iliac veins with the penumbra cat 12 device -PTA of left iliac veins with 12 mm diameter balloon -Stent placement to the left external iliac vein with 16 mm diameter by 6 cm length stent.  5.  Essential hypertension Continue current regimen.  6.  Diabetic neuropathy Continue Neurontin.  7.  Obesity Body mass index is 33.44 kg/m.  Outpatient dietary consultation.  8.  Social/poor living condition. Social worker consulted. PT also consulted.  9.  Pressure ulcer left buttock, right sacrum unstageable.  POA. Continue foam dressing and frequent turning Pressure Injury 07/14/19 Buttocks Right;Medial Unstageable - Full thickness tissue loss in which the base of the injury is covered by slough (yellow, tan, gray, green or brown) and/or eschar (tan, brown or black) in the wound bed. (Active)  07/14/19 1900  Location: Buttocks  Location Orientation: Right;Medial  Staging: Unstageable - Full thickness tissue loss in which the base of the injury is covered by slough (yellow, tan, gray, green or brown) and/or eschar (tan, brown or black) in the wound bed.  Wound Description (Comments):   Present on Admission: Yes     Pressure Injury 07/14/19 Sacrum Mid Unstageable - Full thickness tissue loss in which the base of the  injury is covered by slough (yellow, tan, gray, green or brown) and/or eschar (tan, brown or black) in the wound bed. (Active)  07/14/19 1900  Location: Sacrum  Location  Orientation: Mid  Staging: Unstageable - Full thickness tissue loss in which the base of the injury is covered by slough (yellow, tan, gray, green or brown) and/or eschar (tan, brown or black) in the wound bed.  Wound Description (Comments):   Present on Admission: Yes   Diet: Carb modified diet DVT Prophylaxis: Therapeutic Anticoagulation with Heparin   Advance goals of care discussion: Full code  Family Communication: no family was present at bedside, at the time of interview.   Disposition:  Pt is from home, lives alone, admitted with generalized fatigue fall, DVT, poor living condition and acute kidney injury on chronic kidney disease stage IV, currently unable to go to her prior living arrangements due to poor living social condition and now requiring significant assistance from COVID-19 illness induced deconditioning which actually led to DVT, which precludes a safe discharge. Discharge to SNF when bed is available  Subjective:  no acute complains, has dry cough.   Physical Exam: General: alert and oriented to time, place, and person. Appear in mild distress, affect appropriate Eyes: PERRL, Conjunctiva normal ENT: Oral Mucosa Clear, moist  Neck: no JVD, no Abnormal Mass Or lumps Cardiovascular: S1 and S2 Present, no Murmur, peripheral pulses symmetrical Respiratory: good respiratory effort, Bilateral Air entry equal and Decreased, no signs of accessory muscle use, basal Crackles, no wheezes Abdomen: Bowel Sound present, Soft and no tenderness, no hernia Skin: no rashes  Extremities: bilateral  Pedal edema, no calf tenderness Neurologic: without any new focal findings Gait not checked due to patient safety concerns  Vitals:   07/18/19 0400 07/18/19 0812 07/18/19 2326 07/19/19 0803  BP: (!) 117/33 (!) 168/45  (!) 148/41  Pulse: (!) 58 66  83  Resp: 16 16  16   Temp:  98.1 F (36.7 C) 97.7 F (36.5 C) 98.4 F (36.9 C)  TempSrc:  Oral Oral Oral  SpO2: 99% 100%  98%  Weight:       Height:        Intake/Output Summary (Last 24 hours) at 07/19/2019 1921 Last data filed at 07/19/2019 1630 Gross per 24 hour  Intake --  Output 1200 ml  Net -1200 ml   Filed Weights   07/14/19 1219 07/17/19 1225  Weight: 76.7 kg 72.6 kg    Data Reviewed: I have personally reviewed and interpreted daily labs, tele strips, imagings as discussed above. I reviewed all nursing notes, pharmacy notes, vitals, pertinent old records I have discussed plan of care as described above with RN and patient/family.  CBC: Recent Labs  Lab 07/14/19 1222 07/14/19 1222 07/15/19 0426 07/16/19 0310 07/17/19 0246 07/18/19 0454 07/19/19 0538  WBC 9.7   < > 14.7* 17.2* 15.5* 12.9* 12.6*  NEUTROABS 8.1*  --   --   --   --   --   --   HGB 13.5   < > 12.7 11.8* 10.8* 11.6* 10.9*  HCT 44.6   < > 38.9 36.6 33.9* 37.8 35.0*  MCV 93.5   < > 87.0 88.8 89.9 91.7 90.7  PLT 301   < > 320 288 265 321 310   < > = values in this interval not displayed.   Basic Metabolic Panel: Recent Labs  Lab 07/15/19 1004 07/16/19 0307 07/17/19 0246 07/18/19 0454 07/19/19 0538  NA 133* 136 136 137 134*  K 3.1* 4.0 4.1 4.4 4.7  CL 97* 106 103 109 107  CO2 25 21* 24 20* 19*  GLUCOSE 185* 218* 118* 167* 136*  BUN 88* 75* 67* 55* 52*  CREATININE 2.86* 2.38* 2.11* 1.87* 1.97*  CALCIUM 8.7* 8.3* 8.7* 8.7* 8.9  MG  --   --  1.5* 2.1 1.7    Studies: No results found.  Scheduled Meds: . amLODipine  2.5 mg Oral Daily  . apixaban  10 mg Oral BID   Followed by  . [START ON 07/25/2019] apixaban  5 mg Oral BID  . aspirin EC  81 mg Oral Daily  . atorvastatin  80 mg Oral Daily  . Chlorhexidine Gluconate Cloth  6 each Topical Q0600  . cholecalciferol  5,000 Units Oral Daily  . collagenase   Topical Daily  . gabapentin  300 mg Oral BID  . insulin aspart  0-5 Units Subcutaneous QHS  . insulin aspart  0-9 Units Subcutaneous TID WC  . insulin aspart  5 Units Subcutaneous TID WC  . insulin detemir  22 Units  Subcutaneous Daily  . metoprolol succinate  25 mg Oral Daily  . multivitamin with minerals  1 tablet Oral Daily  . pantoprazole  40 mg Oral Daily  . permethrin   Topical Once  . saccharomyces boulardii  250 mg Oral BID  . venlafaxine XR  75 mg Oral Daily   Continuous Infusions:  PRN Meds: acetaminophen **OR** acetaminophen, dextrose, ondansetron **OR** ondansetron (ZOFRAN) IV, ondansetron (ZOFRAN) IV  Time spent: 35 minutes  Author: Berle Mull, MD [facility] 07/19/2019 7:21 PM  To reach On-call, see care teams to locate the attending and reach out to them via [email or web address]. If 7PM-7AM, please contact night-coverage If you still have difficulty reaching the attending provider, please page the [facility] (Director on Call) for [facility] on amion for assistance.

## 2019-07-19 NOTE — Progress Notes (Signed)
Son Shanon Brow) called and updated on patient's status.

## 2019-07-20 LAB — BASIC METABOLIC PANEL
Anion gap: 10 (ref 5–15)
BUN: 53 mg/dL — ABNORMAL HIGH (ref 8–23)
CO2: 14 mmol/L — ABNORMAL LOW (ref 22–32)
Calcium: 8.9 mg/dL (ref 8.9–10.3)
Chloride: 107 mmol/L (ref 98–111)
Creatinine, Ser: 2.03 mg/dL — ABNORMAL HIGH (ref 0.44–1.00)
GFR calc Af Amer: 28 mL/min — ABNORMAL LOW (ref >60)
GFR calc non Af Amer: 24 mL/min — ABNORMAL LOW (ref >60)
Glucose, Bld: 199 mg/dL — ABNORMAL HIGH (ref 70–99)
Potassium: 5.6 mmol/L — ABNORMAL HIGH (ref 3.5–5.1)
Sodium: 131 mmol/L — ABNORMAL LOW (ref 135–145)

## 2019-07-20 LAB — CBC
HCT: 34.4 % — ABNORMAL LOW (ref 36.0–46.0)
Hemoglobin: 10.4 g/dL — ABNORMAL LOW (ref 12.0–15.0)
MCH: 28 pg (ref 26.0–34.0)
MCHC: 30.2 g/dL (ref 30.0–36.0)
MCV: 92.5 fL (ref 80.0–100.0)
Platelets: 298 10*3/uL (ref 150–400)
RBC: 3.72 MIL/uL — ABNORMAL LOW (ref 3.87–5.11)
RDW: 13.2 % (ref 11.5–15.5)
WBC: 13.5 10*3/uL — ABNORMAL HIGH (ref 4.0–10.5)
nRBC: 0 % (ref 0.0–0.2)

## 2019-07-20 LAB — C-REACTIVE PROTEIN: CRP: 1.4 mg/dL — ABNORMAL HIGH (ref <1.0)

## 2019-07-20 LAB — FIBRIN DERIVATIVES D-DIMER (ARMC ONLY): Fibrin derivatives D-dimer (ARMC): >7500 ng/mL (FEU) — ABNORMAL HIGH (ref 0.00–499.00)

## 2019-07-20 LAB — MAGNESIUM: Magnesium: 1.6 mg/dL — ABNORMAL LOW (ref 1.7–2.4)

## 2019-07-20 LAB — GLUCOSE, CAPILLARY
Glucose-Capillary: 224 mg/dL — ABNORMAL HIGH (ref 70–99)
Glucose-Capillary: 304 mg/dL — ABNORMAL HIGH (ref 70–99)
Glucose-Capillary: 285 mg/dL — ABNORMAL HIGH (ref 70–99)
Glucose-Capillary: 258 mg/dL — ABNORMAL HIGH (ref 70–99)

## 2019-07-20 MED ORDER — SODIUM ZIRCONIUM CYCLOSILICATE 10 G PO PACK
10.0000 g | PACK | Freq: Once | ORAL | Status: AC
  Administered 2019-07-20: 12:00:00 10 g via ORAL
  Filled 2019-07-20 (×2): qty 1

## 2019-07-20 MED ORDER — MAGNESIUM SULFATE 2 GM/50ML IV SOLN
2.0000 g | Freq: Once | INTRAVENOUS | Status: AC
  Administered 2019-07-20: 2 g via INTRAVENOUS
  Filled 2019-07-20: qty 50

## 2019-07-20 NOTE — Progress Notes (Signed)
[facility] Progress Note  Patient: Anne George    [id number]  DOA: 07/14/2019     Date of Service: the patient was seen and examined on 07/20/2019  Chief Complaint  Patient presents with  . Weakness   Brief hospital course: Type I DM, CKD stage IV, chronic lymphedema, rectal cancer SP colostomy, anemia of chronic disease, HTN, chronic venous stasis.  Patient presented with complaints of generalized fatigue and weakness.  She also has poor living conditions reported by EMS.  Had acute kidney injury on chronic kidney disease along with DVT in COVID-19 infection. Currently further plan is continue.  Assessment and Plan: 1.  DKA Type 1 diabetes mellitus with chronic insulin use and chronic kidney disease as well as diabetic retinopathy. Patient actually had anion gap of 18 at the time of admission. Also had acute on chronic kidney disease with severe metabolic acidosis. Blood glucose was 645 and bicarb of 19. Patient was started on IV insulin with Endo tool and IV fluids. Anion gap closed and currently glucose are well controlled. Appreciate diabetic educator. Patient has Longstanding T1 DM since age 74. Hemoglobin A1c 8.4 this admission, 7.6 prior to that. Continue basal bolus sliding scale regimen.  2.  Acute kidney injury on chronic kidney disease stage IV. Combination of progression of the renal disease as well as dehydration and poor p.o. intake. Significant improvement with IV fluids.  Discontinue IV fluids. Baseline renal function around 2.5-3.0. Monitor.  3.  COVID-19 infection.  Diagnosed 07/02/2019 never treated. Currently on room air. Significant elevated D-dimer and CRP. Chest x-ray also negative for any acute infiltrate. Monitor for now.  4.  Acute left lower leg DVT Patient is swelling of the leg with pain. Doppler is positive. Suspect in the setting of limited mobility due to generalized weakness and dehydration as well as acute COVID-19 illness.  Patient was started on therapeutic heparin. Initial plan was to transition to Coumadin and bridged with heparin given the patient's chronic kidney disease. But patient's renal function improved significantly with IV hydration and remains stable despite holding IV hydration and therefore after discussion with the pharmacy patient was transitioned to Eliquis for DVT treatment.  Vascular surgery consulted appreciated their assistance  -Mechanical thrombectomy to the left popliteal, superficial femoral,common femoral, and iliac veins with the penumbra cat 12 device -PTA of left iliac veins with 12 mm diameter balloon -Stent placement to the left external iliac vein with 16 mm diameter by 6 cm length stent.  5.  Essential hypertension Continue current regimen.  6.  Diabetic neuropathy Continue Neurontin.  7.  Obesity Body mass index is 33.44 kg/m.  Outpatient dietary consultation.  8.  Social/poor living condition. Social worker consulted. PT also consulted. Recommend SNF.  Awaiting discharge probably on Monday.  9.  Pressure ulcer left buttock, right sacrum unstageable.  POA. Continue foam dressing and frequent turning Pressure Injury 07/14/19 Buttocks Right;Medial Unstageable - Full thickness tissue loss in which the base of the injury is covered by slough (yellow, tan, gray, green or brown) and/or eschar (tan, brown or black) in the wound bed. (Active)  07/14/19 1900  Location: Buttocks  Location Orientation: Right;Medial  Staging: Unstageable - Full thickness tissue loss in which the base of the injury is covered by slough (yellow, tan, gray, green or brown) and/or eschar (tan, brown or black) in the wound bed.  Wound Description (Comments):   Present on Admission: Yes     Pressure Injury 07/14/19 Sacrum Mid Unstageable - Full thickness  tissue loss in which the base of the injury is covered by slough (yellow, tan, gray, green or brown) and/or eschar (tan, brown or black) in the  wound bed. (Active)  07/14/19 1900  Location: Sacrum  Location Orientation: Mid  Staging: Unstageable - Full thickness tissue loss in which the base of the injury is covered by slough (yellow, tan, gray, green or brown) and/or eschar (tan, brown or black) in the wound bed.  Wound Description (Comments):   Present on Admission: Yes   Diet: Carb modified diet DVT Prophylaxis: Therapeutic Anticoagulation with Heparin   Advance goals of care discussion: Full code  Family Communication: no family was present at bedside, at the time of interview.   Disposition:  Pt is from home, lives alone, admitted with generalized fatigue fall, DVT, poor living condition and acute kidney injury on chronic kidney disease stage IV, currently unable to go to her prior living arrangements due to poor living social condition and now requiring significant assistance from COVID-19 illness induced deconditioning which actually led to DVT, which precludes a safe discharge. At her baseline the patient is independent and lives alone. Discharge to SNF when bed is available  Subjective: No acute complaint no nausea no vomiting no fever no chills.  No chest pain abdominal pain.  Continues to have significant fatigue and tiredness requiring significant assistance.  Physical Exam: General: alert and oriented to time, place, and person. Appear in mild distress, affect appropriate Eyes: PERRL, Conjunctiva normal ENT: Oral Mucosa Clear, moist  Neck: no JVD, no Abnormal Mass Or lumps Cardiovascular: S1 and S2 Present, no Murmur, peripheral pulses symmetrical Respiratory: good respiratory effort, Bilateral Air entry equal and Decreased, no signs of accessory muscle use, basal Crackles, no wheezes Abdomen: Bowel Sound present, Soft and no tenderness, no hernia Skin: no rashes  Extremities: bilateral  Pedal edema, no calf tenderness Neurologic: without any new focal findings Gait not checked due to patient safety concerns   Vitals:   07/18/19 2326 07/19/19 0803 07/20/19 0056 07/20/19 0739  BP:  (!) 148/41    Pulse:  83    Resp:  16    Temp: 97.7 F (36.5 C) 98.4 F (36.9 C) 98.5 F (36.9 C) 97.7 F (36.5 C)  TempSrc: Oral Oral Oral Oral  SpO2:  98%    Weight:      Height:        Intake/Output Summary (Last 24 hours) at 07/20/2019 1947 Last data filed at 07/20/2019 1553 Gross per 24 hour  Intake -  Output 425 ml  Net -425 ml   Filed Weights   07/14/19 1219 07/17/19 1225  Weight: 76.7 kg 72.6 kg    Data Reviewed: I have personally reviewed and interpreted daily labs, tele strips, imagings as discussed above. I reviewed all nursing notes, pharmacy notes, vitals, pertinent old records I have discussed plan of care as described above with RN and patient/family.  CBC: Recent Labs  Lab 07/14/19 1222 07/15/19 0426 07/16/19 0310 07/17/19 0246 07/18/19 0454 07/19/19 0538 07/20/19 0439  WBC 9.7   < > 17.2* 15.5* 12.9* 12.6* 13.5*  NEUTROABS 8.1*  --   --   --   --   --   --   HGB 13.5   < > 11.8* 10.8* 11.6* 10.9* 10.4*  HCT 44.6   < > 36.6 33.9* 37.8 35.0* 34.4*  MCV 93.5   < > 88.8 89.9 91.7 90.7 92.5  PLT 301   < > 288 265 321 310 298   < > =  values in this interval not displayed.   Basic Metabolic Panel: Recent Labs  Lab 07/16/19 0307 07/17/19 0246 07/18/19 0454 07/19/19 0538 07/20/19 0439  NA 136 136 137 134* 131*  K 4.0 4.1 4.4 4.7 5.6*  CL 106 103 109 107 107  CO2 21* 24 20* 19* 14*  GLUCOSE 218* 118* 167* 136* 199*  BUN 75* 67* 55* 52* 53*  CREATININE 2.38* 2.11* 1.87* 1.97* 2.03*  CALCIUM 8.3* 8.7* 8.7* 8.9 8.9  MG  --  1.5* 2.1 1.7 1.6*    Studies: No results found.  Scheduled Meds: . amLODipine  2.5 mg Oral Daily  . apixaban  10 mg Oral BID   Followed by  . [START ON 07/25/2019] apixaban  5 mg Oral BID  . aspirin EC  81 mg Oral Daily  . atorvastatin  80 mg Oral Daily  . cholecalciferol  5,000 Units Oral Daily  . collagenase   Topical Daily  . gabapentin  300  mg Oral BID  . insulin aspart  0-5 Units Subcutaneous QHS  . insulin aspart  0-9 Units Subcutaneous TID WC  . insulin aspart  5 Units Subcutaneous TID WC  . insulin detemir  22 Units Subcutaneous Daily  . metoprolol succinate  25 mg Oral Daily  . multivitamin with minerals  1 tablet Oral Daily  . pantoprazole  40 mg Oral Daily  . permethrin   Topical Once  . saccharomyces boulardii  250 mg Oral BID  . venlafaxine XR  75 mg Oral Daily   Continuous Infusions:  PRN Meds: acetaminophen **OR** acetaminophen, dextrose, ondansetron **OR** ondansetron (ZOFRAN) IV, ondansetron (ZOFRAN) IV  Time spent: 35 minutes  Author: Berle Mull, MD [facility] 07/20/2019 7:47 PM  To reach On-call, see care teams to locate the attending and reach out to them via [email or web address]. If 7PM-7AM, please contact night-coverage If you still have difficulty reaching the attending provider, please page the [facility] (Director on Call) for [facility] on amion for assistance.

## 2019-07-20 NOTE — Progress Notes (Signed)
PT Cancellation Note  Patient Details Name: Anne George MRN: [id number] DOB: 1945/12/06   Cancelled Treatment:    Reason Eval/Treat Not Completed: Patient not medically ready   Chart reviewed.  Potassium noted to be 5.6.  Per PT protocols, will hold therapy session at this time and continue as appropriate.   Chesley Noon 07/20/2019, 11:07 AM

## 2019-07-20 NOTE — Progress Notes (Signed)
Spoke with patient's son Shanon Brow) and updated him the patient's status.

## 2019-07-21 ENCOUNTER — Encounter: Payer: Medicare PPO | Admitting: Occupational Therapy

## 2019-07-21 LAB — CBC
HCT: 34.2 % — ABNORMAL LOW (ref 36.0–46.0)
Hemoglobin: 10.7 g/dL — ABNORMAL LOW (ref 12.0–15.0)
MCH: 28.6 pg (ref 26.0–34.0)
MCHC: 31.3 g/dL (ref 30.0–36.0)
MCV: 91.4 fL (ref 80.0–100.0)
Platelets: 320 10*3/uL (ref 150–400)
RBC: 3.74 MIL/uL — ABNORMAL LOW (ref 3.87–5.11)
RDW: 13.2 % (ref 11.5–15.5)
WBC: 14.8 10*3/uL — ABNORMAL HIGH (ref 4.0–10.5)
nRBC: 0 % (ref 0.0–0.2)

## 2019-07-21 LAB — BASIC METABOLIC PANEL
Anion gap: 10 (ref 5–15)
BUN: 66 mg/dL — ABNORMAL HIGH (ref 8–23)
CO2: 12 mmol/L — ABNORMAL LOW (ref 22–32)
Calcium: 9.1 mg/dL (ref 8.9–10.3)
Chloride: 106 mmol/L (ref 98–111)
Creatinine, Ser: 2.59 mg/dL — ABNORMAL HIGH (ref 0.44–1.00)
GFR calc Af Amer: 21 mL/min — ABNORMAL LOW (ref >60)
GFR calc non Af Amer: 18 mL/min — ABNORMAL LOW (ref >60)
Glucose, Bld: 333 mg/dL — ABNORMAL HIGH (ref 70–99)
Potassium: 5.7 mmol/L — ABNORMAL HIGH (ref 3.5–5.1)
Sodium: 128 mmol/L — ABNORMAL LOW (ref 135–145)

## 2019-07-21 LAB — GLUCOSE, CAPILLARY
Glucose-Capillary: 176 mg/dL — ABNORMAL HIGH (ref 70–99)
Glucose-Capillary: 359 mg/dL — ABNORMAL HIGH (ref 70–99)
Glucose-Capillary: 252 mg/dL — ABNORMAL HIGH (ref 70–99)
Glucose-Capillary: 186 mg/dL — ABNORMAL HIGH (ref 70–99)

## 2019-07-21 LAB — MAGNESIUM: Magnesium: 2.1 mg/dL (ref 1.7–2.4)

## 2019-07-21 LAB — C-REACTIVE PROTEIN: CRP: 1.4 mg/dL — ABNORMAL HIGH (ref <1.0)

## 2019-07-21 LAB — FIBRIN DERIVATIVES D-DIMER (ARMC ONLY): Fibrin derivatives D-dimer (ARMC): >7500 ng/mL (FEU) — ABNORMAL HIGH (ref 0.00–499.00)

## 2019-07-21 MED ORDER — APIXABAN 5 MG PO TABS: ORAL_TABLET | ORAL | 0 refills | Status: DC

## 2019-07-21 MED ORDER — FUROSEMIDE 40 MG PO TABS
40.0000 mg | ORAL_TABLET | Freq: Every day | ORAL | Status: DC
  Administered 2019-07-21 – 2019-07-22 (×2): 40 mg via ORAL
  Filled 2019-07-21 (×2): qty 1

## 2019-07-21 MED ORDER — SODIUM ZIRCONIUM CYCLOSILICATE 10 G PO PACK
10.0000 g | PACK | Freq: Every day | ORAL | Status: DC
  Administered 2019-07-21 – 2019-07-22 (×2): 10 g via ORAL
  Filled 2019-07-21 (×2): qty 1

## 2019-07-21 MED ORDER — ALBUMIN HUMAN 25 % IV SOLN
12.5000 g | Freq: Once | INTRAVENOUS | Status: AC
  Administered 2019-07-21: 18:00:00 12.5 g via INTRAVENOUS
  Filled 2019-07-21: qty 50

## 2019-07-21 MED ORDER — COLLAGENASE 250 UNIT/GM EX OINT: TOPICAL_OINTMENT | Freq: Every day | CUTANEOUS | 0 refills | Status: AC

## 2019-07-21 MED ORDER — INSULIN ASPART 100 UNIT/ML ~~LOC~~ SOLN
6.0000 [IU] | Freq: Three times a day (TID) | SUBCUTANEOUS | Status: DC
  Administered 2019-07-21 – 2019-07-22 (×3): 6 [IU] via SUBCUTANEOUS
  Filled 2019-07-21 (×3): qty 1

## 2019-07-21 MED ORDER — SODIUM BICARBONATE 650 MG PO TABS
650.0000 mg | ORAL_TABLET | Freq: Three times a day (TID) | ORAL | Status: DC
  Administered 2019-07-21 (×2): 650 mg via ORAL
  Filled 2019-07-21 (×3): qty 1

## 2019-07-21 MED ORDER — INSULIN ASPART 100 UNIT/ML ~~LOC~~ SOLN
0.0000 [IU] | Freq: Every day | SUBCUTANEOUS | Status: DC
  Filled 2019-07-21: qty 1

## 2019-07-21 MED ORDER — INSULIN DETEMIR 100 UNIT/ML ~~LOC~~ SOLN: 28.0000 [IU] | Freq: Every day | SUBCUTANEOUS | Status: DC

## 2019-07-21 MED ORDER — INSULIN ASPART 100 UNIT/ML ~~LOC~~ SOLN
0.0000 [IU] | Freq: Three times a day (TID) | SUBCUTANEOUS | Status: DC
  Administered 2019-07-21: 8 [IU] via SUBCUTANEOUS
  Administered 2019-07-22: 11 [IU] via SUBCUTANEOUS
  Filled 2019-07-21 (×2): qty 1

## 2019-07-21 NOTE — NC FL2 (Signed)
[place] LEVEL OF CARE SCREENING TOOL     IDENTIFICATION  Patient Name: Anne George Birthdate: 30-Jul-1945 Sex: female Admission Date (Current Location): 07/14/2019  [place] and [place] Number:  Engineering geologist and Address:  [facility], [address] [place], [place] [address]      Provider Number: [id number]  Attending Physician Name and Address:  Lavina Hamman, MD  Relative Name and Phone Number:  Naasia Minor [phone]    Current Level of Care: Hospital Recommended Level of Care: [place] Prior Approval Number:    Date Approved/Denied:   PASRR Number: [id number]  Discharge Plan: SNF    Current Diagnoses: Patient Active Problem List   Diagnosis Date Noted  . Hyperosmolar non-ketotic state in patient with type 2 diabetes mellitus ([place]) 07/14/2019  . DVT, lower extremity, proximal, acute, left ([place]) 07/14/2019  . COVID-19 virus infection 07/14/2019  . Acute renal failure superimposed on stage 4 chronic kidney disease ([place]) 07/14/2019  . Essential hypertension 07/14/2019  . FTT (failure to thrive) in adult 07/14/2019  . Obesity (BMI 30.0-34.9) 07/14/2019  . Skin ulcer of sacrum, limited to breakdown of skin (HCC)     Orientation RESPIRATION BLADDER Height & Weight     Self, Time, Situation, Place  Normal Continent Weight: 72.6 kg Height:  4\' 10"  (147.3 cm)  BEHAVIORAL SYMPTOMS/MOOD NEUROLOGICAL BOWEL NUTRITION STATUS      Colostomy Diet(heart healty and carb modified)  AMBULATORY STATUS COMMUNICATION OF NEEDS Skin   Limited Assist Verbally Skin abrasions, PU Stage and Appropriate Care, Bruising(right buttock pressure injury- nonadherent gauze and foam dressing)                       Personal Care Assistance Level of Assistance  Bathing, Feeding, Dressing Bathing Assistance: Limited assistance Feeding assistance: Independent Dressing Assistance: Limited assistance     Functional  Limitations Info             SPECIAL CARE FACTORS FREQUENCY  PT (By licensed PT), OT (By licensed OT)     PT Frequency: 5 times per week OT Frequency: 3 times per week            Contractures Contractures Info: Not present    Additional Factors Info  Code Status, Allergies Code Status Info: Full Allergies Info: NKA           Current Medications (07/21/2019):  This is the current hospital active medication list Current Facility-Administered Medications  Medication Dose Route Frequency Provider Last Rate Last Admin  . acetaminophen (TYLENOL) tablet 650 mg  650 mg Oral Q6H PRN Algernon Huxley, MD   650 mg at 07/18/19 1347   Or  . acetaminophen (TYLENOL) suppository 650 mg  650 mg Rectal Q6H PRN Algernon Huxley, MD      . amLODipine (NORVASC) tablet 2.5 mg  2.5 mg Oral Daily Algernon Huxley, MD   2.5 mg at 07/21/19 [id number]  . apixaban (ELIQUIS) tablet 10 mg  10 mg Oral BID Lu Duffel, RPH   10 mg at 07/21/19 [id number]   Followed by  . [START ON 07/25/2019] apixaban (ELIQUIS) tablet 5 mg  5 mg Oral BID Lu Duffel, RPH      . aspirin EC tablet 81 mg  81 mg Oral Daily Algernon Huxley, MD   81 mg at 07/21/19 [id number]  . atorvastatin (LIPITOR) tablet 80 mg  80 mg Oral Daily Dew, Erskine Squibb, MD  80 mg at 07/21/19 0852  . cholecalciferol (VITAMIN D3) tablet 5,000 Units  5,000 Units Oral Daily Algernon Huxley, MD   5,000 Units at 07/21/19 [phone]  . collagenase (SANTYL) ointment   Topical Daily Algernon Huxley, MD   Given at 07/21/19 [phone]  . dextrose 50 % solution 0-50 mL  0-50 mL Intravenous PRN Algernon Huxley, MD      . gabapentin (NEURONTIN) capsule 300 mg  300 mg Oral BID Lavina Hamman, MD   300 mg at 07/21/19 0851  . insulin aspart (novoLOG) injection 0-5 Units  0-5 Units Subcutaneous QHS Algernon Huxley, MD   3 Units at 07/20/19 2229  . insulin aspart (novoLOG) injection 0-9 Units  0-9 Units Subcutaneous TID WC Algernon Huxley, MD   2 Units at 07/21/19 [phone]  . insulin aspart (novoLOG) injection 5  Units  5 Units Subcutaneous TID WC Algernon Huxley, MD   5 Units at 07/21/19 [phone]  . insulin detemir (LEVEMIR) injection 22 Units  22 Units Subcutaneous Daily Algernon Huxley, MD   22 Units at 07/21/19 [phone]  . metoprolol succinate (TOPROL-XL) 24 hr tablet 25 mg  25 mg Oral Daily Algernon Huxley, MD   25 mg at 07/21/19 0851  . multivitamin with minerals tablet 1 tablet  1 tablet Oral Daily Algernon Huxley, MD   1 tablet at 07/21/19 [phone]  . ondansetron (ZOFRAN) tablet 4 mg  4 mg Oral Q6H PRN Algernon Huxley, MD       Or  . ondansetron (ZOFRAN) injection 4 mg  4 mg Intravenous Q6H PRN Algernon Huxley, MD      . ondansetron (ZOFRAN) injection 4 mg  4 mg Intravenous Q6H PRN Algernon Huxley, MD      . pantoprazole (PROTONIX) EC tablet 40 mg  40 mg Oral Daily Algernon Huxley, MD   40 mg at 07/21/19 [id number]  . permethrin (ELIMITE) 5 % cream   Topical Once Lavina Hamman, MD      . saccharomyces boulardii (FLORASTOR) capsule 250 mg  250 mg Oral BID Lavina Hamman, MD   250 mg at 07/21/19 0854  . venlafaxine XR (EFFEXOR-XR) 24 hr capsule 75 mg  75 mg Oral Daily Algernon Huxley, MD   75 mg at 07/21/19 [id number]     Discharge Medications: Please see discharge summary for a list of discharge medications.  Relevant Imaging Results:  Relevant Lab Results:   Additional Information SS# [id number]  Shelbie Hutching, RN

## 2019-07-21 NOTE — Discharge Instructions (Signed)
Information on my medicine - ELIQUIS (apixaban)  This medication education was reviewed with me or my healthcare representative as part of my discharge preparation.  Why was Eliquis prescribed for you? Eliquis was prescribed to treat blood clots that may have been found in the veins of your legs (deep vein thrombosis) or in your lungs (pulmonary embolism) and to reduce the risk of them occurring again.  What do You need to know about Eliquis ? The starting dose is 10 mg (two 5 mg tablets) taken TWICE daily for the FIRST SEVEN (7) DAYS, then on 07/25/19  the dose is reduced to ONE 5 mg tablet taken TWICE daily.  Eliquis may be taken with or without food.   Try to take the dose about the same time in the morning and in the evening. If you have difficulty swallowing the tablet whole please discuss with your pharmacist how to take the medication safely.  Take Eliquis exactly as prescribed and DO NOT stop taking Eliquis without talking to the doctor who prescribed the medication.  Stopping may increase your risk of developing a new blood clot.  Refill your prescription before you run out.  After discharge, you should have regular check-up appointments with your healthcare provider that is prescribing your Eliquis.    What do you do if you miss a dose? If a dose of ELIQUIS is not taken at the scheduled time, take it as soon as possible on the same day and twice-daily administration should be resumed. The dose should not be doubled to make up for a missed dose.  Important Safety Information A possible side effect of Eliquis is bleeding. You should call your healthcare provider right away if you experience any of the following: ? Bleeding from an injury or your nose that does not stop. ? Unusual colored urine (red or dark brown) or unusual colored stools (red or black). ? Unusual bruising for unknown reasons. ? A serious fall or if you hit your head (even if there is no bleeding).  Some  medicines may interact with Eliquis and might increase your risk of bleeding or clotting while on Eliquis. To help avoid this, consult your healthcare provider or pharmacist prior to using any new prescription or non-prescription medications, including herbals, vitamins, non-steroidal anti-inflammatory drugs (NSAIDs) and supplements.  This website has more information on Eliquis (apixaban): http://www.eliquis.com/eliquis/home

## 2019-07-21 NOTE — TOC Progression Note (Signed)
Transition of Care [facility]) - Progression Note    Patient Details  Name: Anne George MRN: [id number] Date of Birth: 1945/09/26  Transition [facility]) CM/SW Contact  Shelbie Hutching, RN Phone Number: 07/21/2019, 2:04 PM  Clinical Narrative:    [facility] has offered a bed and patient can discharge tomorrow. Patient is good with going to [facility].  [place] authorization - Reference number [id number], approved for 8 days starting today, next review 2/8,  Case Manager [facility].  Patient's son Shanon Brow has been updated on plan of care.    Expected Discharge Plan: [place] Barriers to Discharge: Ship broker, SNF Pending bed offer  Expected Discharge Plan and Services Expected Discharge Plan: [place]   Discharge Planning Services: CM Consult Post Acute Care Choice: [place] Living arrangements for the past 2 months: Mobile Home Expected Discharge Date: 07/21/19                                     Social Determinants of Health (SDOH) Interventions    Readmission Risk Interventions No flowsheet data found.

## 2019-07-21 NOTE — Care Management Important Message (Signed)
Important Message  Patient Details  Name: Anne George MRN: [id number] Date of Birth: [birth year]   Medicare Important Message Given:  Yes Patient on isolation unit, IM given to RN to give to patient.     Shelbie Hutching, RN 07/21/2019, 4:24 PM

## 2019-07-21 NOTE — Progress Notes (Signed)
OT Cancellation Note  Patient Details Name: Anne George MRN: [id number] DOB: 08-31-1945   Cancelled Treatment:    Reason Eval/Treat Not Completed: Medical issues which prohibited therapy. Chart reviewed.  Last noted potassium 5.6.  Per OT guidelines for elevated potassium, exertional activity currently contraindicated.  Will hold OT at this time and re-attempt OT treatment session at a later date/time as medically appropriate.  Jeni Salles, MPH, MS, OTR/L ascom [phone] 07/21/19, 11:49 AM

## 2019-07-21 NOTE — Progress Notes (Signed)
[facility] Progress Note  Patient: Anne George    [id number]  DOA: 07/14/2019     Date of Service: the patient was seen and examined on 07/21/2019  Chief Complaint  Patient presents with  . Weakness   Brief hospital course: Type I DM, CKD stage IV, chronic lymphedema, rectal cancer SP colostomy, anemia of chronic disease, HTN, chronic venous stasis.  Patient presented with complaints of generalized fatigue and weakness.  She also has poor living conditions reported by EMS.  Had acute kidney injury on chronic kidney disease along with DVT in COVID-19 infection. Currently further plan is continue.  Assessment and Plan: 1.  DKA Type 1 diabetes mellitus with chronic insulin use and chronic kidney disease as well as diabetic retinopathy. Patient actually had anion gap of 18 at the time of admission. Also had acute on chronic kidney disease with severe metabolic acidosis. Blood glucose was 645 and bicarb of 19. Patient was started on IV insulin with Endo tool and IV fluids. Anion gap closed and currently glucose are well controlled. Appreciate diabetic educator. Patient has Longstanding T1 DM since age 89. Hemoglobin A1c 8.4 this admission, 7.6 prior to that. Continue basal bolus sliding scale regimen.  Hyperglycemia worsening. I will increase the sliding scale I also increase the Levemir  add meal coverage.  2.  Acute kidney injury on chronic kidney disease stage IV. Combination of progression of the renal disease as well as dehydration and poor p.o. intake. Significant improvement with IV fluids.  Discontinue IV fluids. Now renal function is likely worsening. We will provide IV albumin and monitor. Patient clearly volume overloaded with lower extremity and upper extremity edema. Possibly third spacing. Continue with Lasix. Baseline renal function around 2.5-3.0. Monitor. Has mild hyperkalemia. We will provide Lokelma. Hyponatremia is pseudohyponatremia secondary to  hyperglycemia.  3.  COVID-19 infection.  Diagnosed 07/02/2019 never treated. Currently on room air. Significant elevated D-dimer and CRP. Chest x-ray also negative for any acute infiltrate. Monitor for now.  4.  Acute left lower leg DVT Patient is swelling of the leg with pain. Doppler is positive. Suspect in the setting of limited mobility due to generalized weakness and dehydration as well as acute COVID-19 illness. Patient was started on therapeutic heparin. Initial plan was to transition to Coumadin and bridged with heparin given the patient's chronic kidney disease. But patient's renal function improved significantly with IV hydration and remains stable despite holding IV hydration and therefore after discussion with the pharmacy patient was transitioned to Eliquis for DVT treatment.  Vascular surgery consulted appreciated their assistance  -Mechanical thrombectomy to the left popliteal, superficial femoral,common femoral, and iliac veins with the penumbra cat 12 device -PTA of left iliac veins with 12 mm diameter balloon -Stent placement to the left external iliac vein with 16 mm diameter by 6 cm length stent.  5.  Essential hypertension Continue current regimen.  6.  Diabetic neuropathy Continue Neurontin.  7.  Obesity Body mass index is 33.44 kg/m.  Outpatient dietary consultation.  8.  Social/poor living condition. Social worker consulted. PT also consulted. Recommend SNF.  Awaiting discharge probably on Monday.  9.  Pressure ulcer left buttock, right sacrum unstageable.  POA. Continue foam dressing and frequent turning Pressure Injury 07/14/19 Buttocks Right;Medial Unstageable - Full thickness tissue loss in which the base of the injury is covered by slough (yellow, tan, gray, green or brown) and/or eschar (tan, brown or black) in the wound bed. (Active)  07/14/19 1900  Location: Buttocks  Location Orientation: Right;Medial  Staging: Unstageable - Full thickness  tissue loss in which the base of the injury is covered by slough (yellow, tan, gray, green or brown) and/or eschar (tan, brown or black) in the wound bed.  Wound Description (Comments):   Present on Admission: Yes     Pressure Injury 07/14/19 Sacrum Mid Unstageable - Full thickness tissue loss in which the base of the injury is covered by slough (yellow, tan, gray, green or brown) and/or eschar (tan, brown or black) in the wound bed. (Active)  07/14/19 1900  Location: Sacrum  Location Orientation: Mid  Staging: Unstageable - Full thickness tissue loss in which the base of the injury is covered by slough (yellow, tan, gray, green or brown) and/or eschar (tan, brown or black) in the wound bed.  Wound Description (Comments):   Present on Admission: Yes   Diet: Carb modified diet DVT Prophylaxis: Therapeutic Anticoagulation with Heparin   Advance goals of care discussion: Full code  Family Communication: no family was present at bedside, at the time of interview.   Disposition:  Pt is from home, lives alone, admitted with generalized fatigue fall, DVT, poor living condition and acute kidney injury on chronic kidney disease stage IV, currently unable to go to her prior living arrangements due to poor living social condition and now requiring significant assistance from COVID-19 illness induced deconditioning which actually led to DVT, which precludes a safe discharge. At her baseline the patient is independent and lives alone. Discharge to SNF when bed is available  Subjective: Continues to have back pain no nausea no vomiting no fever no chills.  Physical Exam: General: alert and oriented to time, place, and person. Appear in mild distress, affect appropriate Eyes: PERRL, Conjunctiva normal ENT: Oral Mucosa Clear, moist  Neck: no JVD, no Abnormal Mass Or lumps Cardiovascular: S1 and S2 Present, no Murmur, peripheral pulses symmetrical Respiratory: good respiratory effort, Bilateral Air  entry equal and Decreased, no signs of accessory muscle use, basal Crackles, no wheezes Abdomen: Bowel Sound present, Soft and no tenderness, no hernia Skin: no rashes  Extremities: bilateral  Pedal edema, no calf tenderness Neurologic: without any new focal findings Gait not checked due to patient safety concerns  Vitals:   07/21/19 0400 07/21/19 0743 07/21/19 1620 07/21/19 1829  BP:  (!) 150/42 (!) 126/50 (!) 141/96  Pulse: 91 78 77   Resp: 16 16 16    Temp:  98.3 F (36.8 C) 98.1 F (36.7 C)   TempSrc:  Oral Oral   SpO2: 98% 98% 98%   Weight:      Height:        Intake/Output Summary (Last 24 hours) at 07/21/2019 1922 Last data filed at 07/21/2019 0400 Gross per 24 hour  Intake 120 ml  Output 450 ml  Net -330 ml   Filed Weights   07/14/19 1219 07/17/19 1225  Weight: 76.7 kg 72.6 kg    Data Reviewed: I have personally reviewed and interpreted daily labs, tele strips, imagings as discussed above. I reviewed all nursing notes, pharmacy notes, vitals, pertinent old records I have discussed plan of care as described above with RN and patient/family.  CBC: Recent Labs  Lab 07/17/19 0246 07/18/19 0454 07/19/19 0538 07/20/19 0439 07/21/19 0701  WBC 15.5* 12.9* 12.6* 13.5* 14.8*  HGB 10.8* 11.6* 10.9* 10.4* 10.7*  HCT 33.9* 37.8 35.0* 34.4* 34.2*  MCV 89.9 91.7 90.7 92.5 91.4  PLT 265 321 310 298 [id number]   Basic Metabolic Panel: Recent Labs  Lab 07/17/19 0246 07/18/19 0454 07/19/19 0538 07/20/19 0439 07/21/19 0701 07/21/19 1453  NA 136 137 134* 131*  --  128*  K 4.1 4.4 4.7 5.6*  --  5.7*  CL 103 109 107 107  --  106  CO2 24 20* 19* 14*  --  12*  GLUCOSE 118* 167* 136* 199*  --  333*  BUN 67* 55* 52* 53*  --  66*  CREATININE 2.11* 1.87* 1.97* 2.03*  --  2.59*  CALCIUM 8.7* 8.7* 8.9 8.9  --  9.1  MG 1.5* 2.1 1.7 1.6* 2.1  --     Studies: No results found.  Scheduled Meds: . apixaban  10 mg Oral BID   Followed by  . [START ON 07/25/2019] apixaban  5 mg Oral  BID  . aspirin EC  81 mg Oral Daily  . atorvastatin  80 mg Oral Daily  . cholecalciferol  5,000 Units Oral Daily  . collagenase   Topical Daily  . furosemide  40 mg Oral Daily  . gabapentin  300 mg Oral BID  . insulin aspart  0-15 Units Subcutaneous TID WC  . insulin aspart  0-5 Units Subcutaneous QHS  . insulin aspart  6 Units Subcutaneous TID WC  . [START ON 07/22/2019] insulin detemir  28 Units Subcutaneous QHS  . metoprolol succinate  25 mg Oral Daily  . multivitamin with minerals  1 tablet Oral Daily  . pantoprazole  40 mg Oral Daily  . permethrin   Topical Once  . saccharomyces boulardii  250 mg Oral BID  . sodium bicarbonate  650 mg Oral TID  . sodium zirconium cyclosilicate  10 g Oral Daily  . venlafaxine XR  75 mg Oral Daily   Continuous Infusions:  PRN Meds: acetaminophen **OR** acetaminophen, ondansetron **OR** ondansetron (ZOFRAN) IV, ondansetron (ZOFRAN) IV  Time spent: 35 minutes  Author: Berle Mull, MD [facility] 07/21/2019 7:22 PM  To reach On-call, see care teams to locate the attending and reach out to them via [email or web address]. If 7PM-7AM, please contact night-coverage If you still have difficulty reaching the attending provider, please page the [facility] (Director on Call) for [facility] on amion for assistance.

## 2019-07-21 NOTE — TOC Progression Note (Signed)
Transition of Care [facility]) - Progression Note    Patient Details  Name: Anne George MRN: [id number] Date of Birth: [birth year]  [facility]) CM/SW Contact  Shelbie Hutching, RN Phone Number: 07/21/2019, 12:22 PM  Clinical Narrative:    Patient is agreeable to skilled nursing rehab.  Bed search started.  [facility] authorization also started.    Expected Discharge Plan: [place] Barriers to Discharge: Ship broker, SNF Pending bed offer  Expected Discharge Plan and Services Expected Discharge Plan: [place]   Discharge Planning Services: CM Consult Post Acute Care Choice: [place] Living arrangements for the past 2 months: Mobile Home Expected Discharge Date: 07/21/19                                     Social Determinants of Health (SDOH) Interventions    Readmission Risk Interventions No flowsheet data found.

## 2019-07-21 NOTE — Progress Notes (Signed)
Inpatient Diabetes Program Recommendations  AACE/ADA: New Consensus Statement on Inpatient Glycemic Control (2015)  Target Ranges:  Prepandial:   less than 140 mg/dL      [facility] postprandial:   less than 180 mg/dL (1-2 hours)      Critically ill patients:  140 - 180 mg/dL    Results for Anne George, Anne George (MRN [id number]) as of 07/21/2019 10:27  Ref. Range 07/20/2019 07:40 07/20/2019 11:36 07/20/2019 17:19 07/20/2019 21:26 07/21/2019 07:42  Glucose-Capillary Latest Ref Range: 70 - 99 mg/dL 224 (H) 304 (H) 285 (H) 258 (H) 176 (H)    Home DM Meds: Lantus 28 units QHS       Novolog 2 units for every 15 grams Carbs   Current Orders: Levemir 22 units Daily      Novolog Sensitive Correction Scale/ SSI (0-9 units) TID AC + HS      Novolog 5 units TID with meals    MD- Please consider increasing Novolog Meal Coverage to:   Novolog 8 units TID with meals    --Will follow patient during hospitalization--  Tama Headings RN, MSN, BC-ADM Inpatient Diabetes Coordinator Team Pager [phone] (8a-5p)

## 2019-07-21 NOTE — Progress Notes (Signed)
PT Cancellation Note  Patient Details Name: Anne George MRN: [id number] DOB: 04-Apr-1946   Cancelled Treatment:    Reason Eval/Treat Not Completed: Patient not medically ready.  Chart reviewed.  Last noted potassium 5.6.  Per PT guidelines for elevated potassium, exertional activity currently contraindicated.  Will hold PT at this time and re-attempt PT treatment session at a later date/time as medically appropriate.   Raquel Sarna Koltan Portocarrero 07/21/2019, 11:20 AM

## 2019-07-22 LAB — CBC
HCT: 30.4 % — ABNORMAL LOW (ref 36.0–46.0)
Hemoglobin: 9.6 g/dL — ABNORMAL LOW (ref 12.0–15.0)
MCH: 29 pg (ref 26.0–34.0)
MCHC: 31.6 g/dL (ref 30.0–36.0)
MCV: 91.8 fL (ref 80.0–100.0)
Platelets: 317 10*3/uL (ref 150–400)
RBC: 3.31 MIL/uL — ABNORMAL LOW (ref 3.87–5.11)
RDW: 13.2 % (ref 11.5–15.5)
WBC: 12 10*3/uL — ABNORMAL HIGH (ref 4.0–10.5)
nRBC: 0 % (ref 0.0–0.2)

## 2019-07-22 LAB — BASIC METABOLIC PANEL
Anion gap: 6 (ref 5–15)
BUN: 67 mg/dL — ABNORMAL HIGH (ref 8–23)
CO2: 20 mmol/L — ABNORMAL LOW (ref 22–32)
Calcium: 9.4 mg/dL (ref 8.9–10.3)
Chloride: 108 mmol/L (ref 98–111)
Creatinine, Ser: 2.6 mg/dL — ABNORMAL HIGH (ref 0.44–1.00)
GFR calc Af Amer: 21 mL/min — ABNORMAL LOW (ref >60)
GFR calc non Af Amer: 18 mL/min — ABNORMAL LOW (ref >60)
Glucose, Bld: 105 mg/dL — ABNORMAL HIGH (ref 70–99)
Potassium: 5.3 mmol/L — ABNORMAL HIGH (ref 3.5–5.1)
Sodium: 134 mmol/L — ABNORMAL LOW (ref 135–145)

## 2019-07-22 LAB — GLUCOSE, CAPILLARY
Glucose-Capillary: 98 mg/dL (ref 70–99)
Glucose-Capillary: 313 mg/dL — ABNORMAL HIGH (ref 70–99)

## 2019-07-22 LAB — C-REACTIVE PROTEIN: CRP: 1.3 mg/dL — ABNORMAL HIGH (ref <1.0)

## 2019-07-22 LAB — FIBRIN DERIVATIVES D-DIMER (ARMC ONLY): Fibrin derivatives D-dimer (ARMC): 6680.74 ng/mL (FEU) — ABNORMAL HIGH (ref 0.00–499.00)

## 2019-07-22 LAB — MAGNESIUM: Magnesium: 1.9 mg/dL (ref 1.7–2.4)

## 2019-07-22 MED ORDER — FUROSEMIDE 20 MG PO TABS: 40.0000 mg | ORAL_TABLET | Freq: Every day | ORAL | 0 refills | Status: DC

## 2019-07-22 MED ORDER — SODIUM BICARBONATE 650 MG PO TABS
650.0000 mg | ORAL_TABLET | Freq: Two times a day (BID) | ORAL | Status: DC
  Administered 2019-07-22: 10:00:00 650 mg via ORAL
  Filled 2019-07-22 (×2): qty 1

## 2019-07-22 MED ORDER — METHOCARBAMOL 500 MG PO TABS
500.0000 mg | ORAL_TABLET | Freq: Three times a day (TID) | ORAL | Status: DC | PRN
  Filled 2019-07-22: qty 1

## 2019-07-22 MED ORDER — METHOCARBAMOL 500 MG PO TABS: 500.0000 mg | ORAL_TABLET | Freq: Three times a day (TID) | ORAL | 0 refills | Status: AC | PRN

## 2019-07-22 NOTE — Progress Notes (Signed)
OT Cancellation Note  Patient Details Name: Anne George MRN: [id number] DOB: 1945-11-11   Cancelled Treatment:    Reason Eval/Treat Not Completed: Medical issues which prohibited therapy. Chart reviewed, pt's K+ continues to remain elevated above guidelines for participation in therapy despite medication given overnight to address hyperkalemia. (K+ 5.3). Will continue to follow acutely and re-attempt once K+ improves and pt is medically appropriate.   Jeni Salles, MPH, MS, OTR/L ascom [phone] 07/22/19, 10:53 AM

## 2019-07-22 NOTE — Progress Notes (Signed)
PT Cancellation Note  Patient Details Name: Anne George MRN: [id number] DOB: 07-05-1945   Cancelled Treatment:    Reason Eval/Treat Not Completed: Patient not medically ready.  Chart reviewed.  Pt's potassium noted to be elevated to 5.3 this morning.  Per PT guidelines for elevated potassium, exertional activity currently contraindicated.  Will hold PT at this time and re-attempt PT treatment session at a later date/time as medically appropriate.  Leitha Bleak, PT 07/22/19, 10:57 AM

## 2019-07-22 NOTE — Discharge Summary (Addendum)
[facility] Discharge Summary   Patient: Anne George [id number]  PCP: Anne Pierini, MD  Date of admission: 07/14/2019   Date of discharge:  07/22/2019     Discharge Diagnoses:   Active Problems:   Hyperosmolar non-ketotic state in patient with type 2 diabetes mellitus ([place])   DVT, lower extremity, proximal, acute, left ([place])   COVID-19 virus infection   Acute renal failure superimposed on stage 4 chronic kidney disease ([place])   Essential hypertension   FTT (failure to thrive) in adult   Obesity (BMI 30.0-34.9)   Admitted From: home Disposition:  SNF   Recommendations for Outpatient Follow-up:  1. PCP: please follow up with PCP in 1 week 2. Follow up LABS/TEST:  BMP on thursday   Contact information for follow-up providers    Anne Hartmann, NP. Go on 07/30/2019.   Specialty: Vascular Surgery Why: @10 :00 AM  Contact information: [place] [address] [phone]        Anne Pierini, MD. Go on 07/24/2019.   Specialty: Family Medicine Why: @9 :10 AM Virtual Visit Only  Contact information: [address] [id number] [facility] [place] [address] [phone]            Contact information for after-discharge care    Destination    [facility] Preferred SNF .   Service: Skilled Nursing Contact information: [address] [place] [phone]                 Diet recommendation: Renal diet  Activity: The patient is advised to gradually reintroduce usual activities, as tolerated  Discharge Condition: stable  Code Status: Full code   History of present illness: As per the H and P dictated on admission, "Anne George  is a 74 y.o. female, with history of type 1 diabetes mellitus on insulin with chronic kidney disease stage IV (baseline creatinine around 2.5), chronic lymphedema (being followed by outpatient wound care), hypertension, colostomy status, anemia of  chronic disease, chronic venous stasis who was brought by EMS after patient called being unable to get out of bed and having difficulty ambulating for past few days.  She was seen by her PCP about 12 days back on 1/13 for fever and tested positive for COVID-19.  She reports at that time she had fever of 104 F and some dyspnea on exertion but no other symptoms.  She stayed at home and did not have any other symptoms and felt recovered.  However for the past 3-4 days she has been feeling increasingly weak having difficulty walking with pain in both her legs.  She also had very poor appetite and also was not hydrating herself well.  She also reports some polyuria.  She has been having some nonproductive cough for past 3 days as well.  She denies any shortness of breath, chills, fever, nausea, vomiting, chest pain, palpitations, abdominal pain, diarrhea, dysuria, fall, joint pains.  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  Denies any weight loss or sick contacts.  Patient reports taking Lantus 28 units at bedtime and sliding scale insulin premeal with every carb intake.  Given her poor appetite she was not taking premeal insulin.  Last night her blood glucose was 194 but this morning it went up to 400.  EMS found that her home condition was very filthy and messy with several roaches.  In the ED she was tachycardic to low 100s, maintaining O2 sat on  room air, normal blood pressure, respiratory rate and afebrile.  Blood work showed normal CBC.  Chemistry showed sodium of 129, chloride of 92, CO2 19 with a gap of 18, acute on chronic kidney injury with BUN of 95 and creatinine of 3.54, glucose of 645.  UA was negative for ketones.  Fibrin derivative D-dimer was >10,000.  Ferritin, procalcitonin and troponin were normal.  C-reactive protein pending. .  Patient given IV normal saline bolus, 10 units regular insulin and started on IV insulin drip.  Doppler lower extremity was done in the ED given her  significantly high D-dimer with preliminary finding of occluded proximal popliteal and femoral veins."  Hospital Course:  Summary of her active problems in the hospital is as following.   1.  DKA Type 1 diabetes mellitus with chronic insulin use and chronic kidney disease as well as diabetic retinopathy. Patient actually had anion gap of 18 at the time of admission. Also had acute on chronic kidney disease with severe metabolic acidosis. Blood glucose was 645 and bicarb of 19. Patient was started on IV insulin with Endo tool and IV fluids. Anion gap closed and currently glucose are well controlled. Appreciate diabetic educator. Patient has Longstanding T1 DM since age 18. Hemoglobin A1c 8.4 this admission, 7.6 prior to that. Continue basal bolus sliding scale regimen. Continue her home regimen  2.  Acute kidney injury on chronic kidney disease stage IV. mild hyperkalemia. Combination of progression of the renal disease as well as dehydration and poor p.o. intake. Significant improvement with IV fluids.  Discontinue IV fluids. Now renal function is likely worsening. We will provide IV albumin and monitor. Patient clearly volume overloaded with lower extremity and upper extremity edema. Possibly third spacing. Continue with Lasix. Baseline renal function around 2.5-3.0. Monitor. Has mild hyperkalemia. Treated with  Lokelma. Pt was informed to comply with renal diet. Hyponatremia is pseudohyponatremia secondary to hyperglycemia.  3.  COVID-19 infection.  Diagnosed 07/02/2019 never treated. Currently on room air. Significant elevated D-dimer and CRP. Chest x-ray also negative for any acute infiltrate. Monitor for now.  4.  Acute left lower leg DVT Patient is swelling of the leg with pain. Doppler is positive. Suspect in the setting of limited mobility due to generalized weakness and dehydration as well as acute COVID-19 illness. Patient was started on therapeutic  heparin. Initial plan was to transition to Coumadin and bridged with heparin given the patient's chronic kidney disease. But patient's renal function improved significantly with IV hydration and remains stable despite holding IV hydration and therefore after discussion with the pharmacy patient was transitioned to Eliquis for DVT treatment.  Vascular surgery consulted appreciated their assistance  -Mechanical thrombectomy to the left popliteal, superficial femoral,common femoral, and iliac veins with the penumbra cat 12 device -PTA of left iliac veins with 12 mm diameter balloon -Stent placement to the left external iliac vein with 64mm diameter by 6 cm length stent.  5.  Essential hypertension Continue current regimen.  6.  Diabetic neuropathy Continue Neurontin.  7.  Obesity Body mass index is 33.44 kg/m.  Outpatient dietary consultation.  8.  Social/poor living condition. Social worker consulted. PT also consulted. Recommend SNF.  Awaiting discharge probably on Monday.  9.  Pressure ulcer left buttock, right sacrum unstageable.  POA. Continue foam dressing and frequent turning  Pressure Injury 07/14/19 Buttocks Right;Medial Unstageable - Full thickness tissue loss in which the base of the injury is covered by slough (yellow, tan, gray, green or brown) and/or eschar (  tan, brown or black) in the wound bed. (Active)  07/14/19 1900  Location: Buttocks  Location Orientation: Right;Medial  Staging: Unstageable - Full thickness tissue loss in which the base of the injury is covered by slough (yellow, tan, gray, green or brown) and/or eschar (tan, brown or black) in the wound bed.  Wound Description (Comments):   Present on Admission: Yes     Pressure Injury 07/14/19 Sacrum Mid Unstageable - Full thickness tissue loss in which the base of the injury is covered by slough (yellow, tan, gray, green or brown) and/or eschar (tan, brown or black) in the wound bed. (Active)  07/14/19  1900  Location: Sacrum  Location Orientation: Mid  Staging: Unstageable - Full thickness tissue loss in which the base of the injury is covered by slough (yellow, tan, gray, green or brown) and/or eschar (tan, brown or black) in the wound bed.  Wound Description (Comments):   Present on Admission: Yes    Patient was seen by physical therapy, who recommended SNF, which was arranged. On the day of the discharge the patient's vitals were stable, and no other acute medical condition were reported by patient. the patient was felt safe to be discharge at SNF with SNF.  Consultants: vascular surgery PROCEDURE: 1. US guidance for vascular access to left popliteal vein 2. Catheter placement into left common iliac vein from left popliteal approach 3. IVC gram and left lower extremity venogram 4.   Catheter directed thrombolysis with 6 mg of TPA to the left popliteal and femoral vein  5. Mechanical thrombectomy to the left popliteal, superficial femoral, common femoral, and iliac veins with the penumbra cat 12 device 6. PTA of left iliac veins with 12 mm diameter balloon 7. Stent placement to the left external iliac vein with 16 mm diameter by 6 cm length stent  Discharge Exam: General: Appear in mild distress, no Rash; Oral Mucosa Clear, moist. Cardiovascular: S1 and S2 Present, no Murmur, Respiratory: normal respiratory effort, Bilateral Air entry present and no Crackles, no wheezes Abdomen: Bowel Sound present, Soft and no tenderness, no hernia Extremities: no Pedal edema, no calf tenderness Neurology: alert and oriented to time, place, and person affect appropriate.  Filed Weights   07/14/19 1219 07/17/19 1225  Weight: 76.7 kg 72.6 kg   Vitals:   07/22/19 0028 07/22/19 0753  BP: (!) 145/34 (!) 134/35  Pulse: 73 66  Resp: 16 15  Temp: 98 F (36.7 C) 97.6 F (36.4 C)  SpO2: 100% 98%    DISCHARGE MEDICATION: Allergies as of 07/22/2019   No Known Allergies      Medication List    TAKE these medications   amLODipine 2.5 MG tablet Commonly known as: NORVASC Take 2.5 mg by mouth daily.   apixaban 5 MG Tabs tablet Commonly known as: Eliquis Take 2 tablets (10mg ) twice daily for 7 days, then 1 tablet (5mg ) twice daily   aspirin 81 MG EC tablet Take 81 mg by mouth daily.   atorvastatin 80 MG tablet Commonly known as: LIPITOR Take 80 mg by mouth daily.   Cholecalciferol 125 MCG (5000 UT) Tabs Take 5,000 Units by mouth daily.   collagenase ointment Commonly known as: SANTYL Apply topically daily.   Complete Tabs Take 1 tablet by mouth daily.   furosemide 20 MG tablet Commonly known as: LASIX Take 40 mg by mouth 2 (two) times daily.   gabapentin 100 MG capsule Commonly known as: NEURONTIN Take 300 mg by mouth 3 (three) times daily as  needed.   Lantus SoloStar 100 UNIT/ML Solostar Pen Generic drug: Insulin Glargine INJECT  28 UNITS SUBCUTANEOUSLY EVERY NIGHT   methocarbamol 500 MG tablet Commonly known as: ROBAXIN Take 1 tablet (500 mg total) by mouth every 8 (eight) hours as needed for muscle spasms.   metoprolol succinate 25 MG 24 hr tablet Commonly known as: TOPROL-XL Take 25 mg by mouth daily.   NovoLOG FlexPen 100 UNIT/ML FlexPen Generic drug: insulin aspart INJECT BEFORE MEALS AND SNACKS 3 TO 5 TIMES DAILY AS NEEDED (2 UNITS FOR EVERY 15 GRAMS CARBS). PEN IN USE EXPIRES 28 DAYS   omeprazole 20 MG capsule Commonly known as: PRILOSEC Take 20 mg by mouth daily.   saccharomyces boulardii 250 MG capsule Commonly known as: FLORASTOR Take 250 mg by mouth 2 (two) times daily.   sodium bicarbonate 650 MG tablet Take 650 mg by mouth 2 (two) times daily.   venlafaxine XR 37.5 MG 24 hr capsule Commonly known as: EFFEXOR-XR Take 2 capsules by mouth daily.      No Known Allergies Discharge Instructions    MyChart COVID-19 home monitoring program   Complete by: Jul 21, 2019    Is the patient willing to use the [place] for home monitoring?: No   Diet - low sodium heart healthy   Complete by: As directed    Increase activity slowly   Complete by: As directed       The results of significant diagnostics from this hospitalization (including imaging, microbiology, ancillary and laboratory) are listed below for reference.    Significant Diagnostic Studies: PERIPHERAL VASCULAR CATHETERIZATION  Result Date: 07/17/2019 See op note  US Venous Img Lower Bilateral (DVT)  Result Date: 07/14/2019 CLINICAL DATA:  74 year old female with bilateral LOWER extremity pain for 3 months. EXAM: BILATERAL LOWER EXTREMITY VENOUS DOPPLER ULTRASOUND TECHNIQUE: Gray-scale sonography with compression, as well as color and duplex ultrasound, were performed to evaluate the deep venous system(s) from the level of the common femoral vein through the popliteal and proximal calf veins. COMPARISON:  None. FINDINGS: RIGHT: Normal compressibility of the common femoral, superficial femoral, and popliteal veins, as well as the visualized calf veins. Visualized portions of profunda femoral vein and great saphenous vein unremarkable. No filling defects to suggest DVT on grayscale or color Doppler imaging. Doppler waveforms show normal direction of venous flow, normal respiratory phasicity and response to augmentation. LEFT: Occlusive DVT is noted throughout the LEFT LOWER extremity including the common femoral, profundus femoral, femoral, popliteal and visualized calf veins. IMPRESSION: 1. Occlusive DVT throughout the visualized LEFT LOWER extremity. 2. No evidence of RIGHT LOWER extremity DVT. Critical Value/emergent results were called by telephone at the time of interpretation on 07/14/2019 at 5:59 pm to [facility] , who verbally acknowledged these results. Electronically Signed   By: Margarette Canada M.D.   On: 07/14/2019 18:03   DG Chest Portable 1 View  Result Date: 07/14/2019 CLINICAL DATA:  Recent COVID-19 positive.   Hyperglycemia. EXAM: PORTABLE CHEST 1 VIEW COMPARISON:  None. FINDINGS: Lungs are clear. Heart size and pulmonary vascularity are normal. No adenopathy. There is aortic atherosclerosis. No bone lesions. IMPRESSION: Lungs clear. Cardiac silhouette normal. No adenopathy. Aortic Atherosclerosis (ICD10-I70.0). Electronically Signed   By: Lowella Grip III M.D.   On: 07/14/2019 13:17    Microbiology: Recent Results (from the past 240 hour(s))  MRSA PCR Screening     Status: Abnormal   Collection Time: 07/14/19  6:57 PM   Specimen: Nasopharyngeal  Result Value  Ref Range Status   MRSA by PCR POSITIVE (A) NEGATIVE Final    Comment:        The GeneXpert MRSA Assay (FDA approved for NASAL specimens only), is one component of a comprehensive MRSA colonization surveillance program. It is not intended to diagnose MRSA infection nor to guide or monitor treatment for MRSA infections. RESULT CALLED TO, READ BACK BY AND VERIFIED WITH: RN TIFF @2040  07/14/19 AKT Performed at [facility] [address] [place], [place] [address]      Labs: CBC: Recent Labs  Lab 07/18/19 [phone] 07/19/19 0538 07/20/19 0439 07/21/19 0701 07/22/19 0505  WBC 12.9* 12.6* 13.5* 14.8* 12.0*  HGB 11.6* 10.9* 10.4* 10.7* 9.6*  HCT 37.8 35.0* 34.4* 34.2* 30.4*  MCV 91.7 90.7 92.5 91.4 91.8  PLT 321 310 298 320 [id number]   Basic Metabolic Panel: Recent Labs  Lab 07/18/19 0454 07/19/19 0538 07/20/19 0439 07/21/19 0701 07/21/19 1453 07/22/19 0505  NA 137 134* 131*  --  128* 134*  K 4.4 4.7 5.6*  --  5.7* 5.3*  CL 109 107 107  --  106 108  CO2 20* 19* 14*  --  12* 20*  GLUCOSE 167* 136* 199*  --  333* 105*  BUN 55* 52* 53*  --  66* 67*  CREATININE 1.87* 1.97* 2.03*  --  2.59* 2.60*  CALCIUM 8.7* 8.9 8.9  --  9.1 9.4  MG 2.1 1.7 1.6* 2.1  --  1.9   Liver Function Tests: No results for input(s): AST, ALT, ALKPHOS, BILITOT, PROT, ALBUMIN in the last 168 hours. No results for input(s): LIPASE, AMYLASE in  the last 168 hours. No results for input(s): AMMONIA in the last 168 hours. Cardiac Enzymes: No results for input(s): CKTOTAL, CKMB, CKMBINDEX, TROPONINI in the last 168 hours. BNP (last 3 results) No results for input(s): BNP in the last 8760 hours. CBG: Recent Labs  Lab 07/21/19 0742 07/21/19 1321 07/21/19 1620 07/21/19 2128 07/22/19 0753  GLUCAP 176* 359* 252* 186* 98    Time spent: 35 minutes  Signed:  Berle Mull  [facility]  07/22/2019 11:30 AM

## 2019-07-22 NOTE — Progress Notes (Signed)
Report called to Nannie at [facility] at this time. Stated understanding. Patient given D/C instructions, stated understanding. EMS to pick up to transfer patient.

## 2019-07-22 NOTE — TOC Transition Note (Signed)
Transition of Care [facility]) - CM/SW Discharge Note   Patient Details  Name: Anne George MRN: [id number] Date of Birth: 21-Oct-1945  [facility]) CM/SW Contact:  Shelbie Hutching, RN Phone Number: 07/22/2019, 1:39 PM   Clinical Narrative:    Patient has been medically cleared for discharge to [facility] today.  Patient will go to room 802, bedside RN will call report to [phone].  [facility] will provide transport to facility.  Patient's son Shanon Brow has been updated on discharge.    Final next level of care: Skilled Nursing Facility Barriers to Discharge: Barriers Resolved   Patient Goals and CMS Choice Patient states their goals for this hospitalization and ongoing recovery are:: Patient wants whatever can help her and is okay with SNF CMS Medicare.gov Compare Post Acute Care list provided to:: Patient Choice offered to / list presented to : Patient  Discharge Placement              Patient chooses bed at: [facility] Patient to be transferred to facility by: [facility] Name of family member notified: Shellia Carwin Patient and family notified of of transfer: 07/22/19  Discharge Plan and Services   Discharge Planning Services: CM Consult Post Acute Care Choice: [place]                               Social Determinants of Health (SDOH) Interventions     Readmission Risk Interventions No flowsheet data found.

## 2019-07-23 ENCOUNTER — Encounter: Payer: Medicare PPO | Admitting: Occupational Therapy

## 2019-07-28 ENCOUNTER — Encounter: Payer: Medicare PPO | Admitting: Occupational Therapy

## 2019-07-29 ENCOUNTER — Other Ambulatory Visit (INDEPENDENT_AMBULATORY_CARE_PROVIDER_SITE_OTHER): Payer: Self-pay | Admitting: Vascular Surgery

## 2019-07-29 ENCOUNTER — Other Ambulatory Visit (INDEPENDENT_AMBULATORY_CARE_PROVIDER_SITE_OTHER): Payer: Self-pay | Admitting: Family Medicine

## 2019-07-29 DIAGNOSIS — Z95820 Peripheral vascular angioplasty status with implants and grafts: Secondary | ICD-10-CM

## 2019-07-29 DIAGNOSIS — Z09 Encounter for follow-up examination after completed treatment for conditions other than malignant neoplasm: Secondary | ICD-10-CM

## 2019-07-29 DIAGNOSIS — I82422 Acute embolism and thrombosis of left iliac vein: Secondary | ICD-10-CM

## 2019-07-30 ENCOUNTER — Encounter: Payer: Medicare PPO | Admitting: Occupational Therapy

## 2019-07-30 ENCOUNTER — Ambulatory Visit (INDEPENDENT_AMBULATORY_CARE_PROVIDER_SITE_OTHER): Payer: Medicare PPO

## 2019-07-30 ENCOUNTER — Encounter (INDEPENDENT_AMBULATORY_CARE_PROVIDER_SITE_OTHER): Payer: Self-pay | Admitting: Nurse Practitioner

## 2019-07-30 ENCOUNTER — Ambulatory Visit (INDEPENDENT_AMBULATORY_CARE_PROVIDER_SITE_OTHER): Payer: Medicare PPO | Admitting: Nurse Practitioner

## 2019-07-30 ENCOUNTER — Other Ambulatory Visit: Payer: Self-pay

## 2019-07-30 VITALS — BP 122/53 | HR 81 | Resp 16

## 2019-07-30 DIAGNOSIS — I824Y2 Acute embolism and thrombosis of unspecified deep veins of left proximal lower extremity: Secondary | ICD-10-CM | POA: Diagnosis not present

## 2019-07-30 DIAGNOSIS — I10 Essential (primary) hypertension: Secondary | ICD-10-CM

## 2019-07-30 DIAGNOSIS — Z95820 Peripheral vascular angioplasty status with implants and grafts: Secondary | ICD-10-CM | POA: Diagnosis not present

## 2019-07-30 DIAGNOSIS — I82422 Acute embolism and thrombosis of left iliac vein: Secondary | ICD-10-CM | POA: Diagnosis not present

## 2019-08-04 ENCOUNTER — Emergency Department: Payer: Medicare PPO

## 2019-08-04 ENCOUNTER — Encounter (INDEPENDENT_AMBULATORY_CARE_PROVIDER_SITE_OTHER): Payer: Self-pay | Admitting: Nurse Practitioner

## 2019-08-04 ENCOUNTER — Inpatient Hospital Stay
Admission: EM | Admit: 2019-08-04 | Discharge: 2019-08-11 | DRG: 689 | Disposition: A | Discharge status: Skilled Nursing Facility | Payer: Medicare PPO | Source: Skilled Nursing Facility | Attending: Internal Medicine | Admitting: Internal Medicine

## 2019-08-04 ENCOUNTER — Other Ambulatory Visit: Payer: Self-pay

## 2019-08-04 DIAGNOSIS — R310 Gross hematuria: Secondary | ICD-10-CM

## 2019-08-04 DIAGNOSIS — Z794 Long term (current) use of insulin: Secondary | ICD-10-CM | POA: Diagnosis not present

## 2019-08-04 DIAGNOSIS — R531 Weakness: Secondary | ICD-10-CM

## 2019-08-04 DIAGNOSIS — N939 Abnormal uterine and vaginal bleeding, unspecified: Secondary | ICD-10-CM | POA: Diagnosis not present

## 2019-08-04 DIAGNOSIS — L89620 Pressure ulcer of left heel, unstageable: Secondary | ICD-10-CM | POA: Diagnosis not present

## 2019-08-04 DIAGNOSIS — E876 Hypokalemia: Secondary | ICD-10-CM | POA: Diagnosis present

## 2019-08-04 DIAGNOSIS — E872 Acidosis: Secondary | ICD-10-CM | POA: Diagnosis present

## 2019-08-04 DIAGNOSIS — L89610 Pressure ulcer of right heel, unstageable: Secondary | ICD-10-CM | POA: Diagnosis present

## 2019-08-04 DIAGNOSIS — S70322A Blister (nonthermal), left thigh, initial encounter: Secondary | ICD-10-CM | POA: Diagnosis present

## 2019-08-04 DIAGNOSIS — N3001 Acute cystitis with hematuria: Secondary | ICD-10-CM | POA: Diagnosis not present

## 2019-08-04 DIAGNOSIS — Z8616 Personal history of COVID-19: Secondary | ICD-10-CM | POA: Diagnosis not present

## 2019-08-04 DIAGNOSIS — Z1611 Resistance to penicillins: Secondary | ICD-10-CM | POA: Diagnosis present

## 2019-08-04 DIAGNOSIS — L89323 Pressure ulcer of left buttock, stage 3: Secondary | ICD-10-CM | POA: Diagnosis present

## 2019-08-04 DIAGNOSIS — L89153 Pressure ulcer of sacral region, stage 3: Secondary | ICD-10-CM | POA: Diagnosis present

## 2019-08-04 DIAGNOSIS — I82402 Acute embolism and thrombosis of unspecified deep veins of left lower extremity: Secondary | ICD-10-CM | POA: Diagnosis present

## 2019-08-04 DIAGNOSIS — N179 Acute kidney failure, unspecified: Secondary | ICD-10-CM | POA: Diagnosis present

## 2019-08-04 DIAGNOSIS — D62 Acute posthemorrhagic anemia: Secondary | ICD-10-CM | POA: Diagnosis present

## 2019-08-04 DIAGNOSIS — E1122 Type 2 diabetes mellitus with diabetic chronic kidney disease: Secondary | ICD-10-CM | POA: Diagnosis not present

## 2019-08-04 DIAGNOSIS — Z6833 Body mass index (BMI) 33.0-33.9, adult: Secondary | ICD-10-CM

## 2019-08-04 DIAGNOSIS — Z8614 Personal history of Methicillin resistant Staphylococcus aureus infection: Secondary | ICD-10-CM

## 2019-08-04 DIAGNOSIS — Z7982 Long term (current) use of aspirin: Secondary | ICD-10-CM

## 2019-08-04 DIAGNOSIS — Z923 Personal history of irradiation: Secondary | ICD-10-CM

## 2019-08-04 DIAGNOSIS — Z87440 Personal history of urinary (tract) infections: Secondary | ICD-10-CM

## 2019-08-04 DIAGNOSIS — L97419 Non-pressure chronic ulcer of right heel and midfoot with unspecified severity: Secondary | ICD-10-CM

## 2019-08-04 DIAGNOSIS — E1051 Type 1 diabetes mellitus with diabetic peripheral angiopathy without gangrene: Secondary | ICD-10-CM | POA: Diagnosis present

## 2019-08-04 DIAGNOSIS — N184 Chronic kidney disease, stage 4 (severe): Secondary | ICD-10-CM | POA: Diagnosis present

## 2019-08-04 DIAGNOSIS — I129 Hypertensive chronic kidney disease with stage 1 through stage 4 chronic kidney disease, or unspecified chronic kidney disease: Secondary | ICD-10-CM | POA: Diagnosis present

## 2019-08-04 DIAGNOSIS — X58XXXA Exposure to other specified factors, initial encounter: Secondary | ICD-10-CM | POA: Diagnosis present

## 2019-08-04 DIAGNOSIS — I82409 Acute embolism and thrombosis of unspecified deep veins of unspecified lower extremity: Secondary | ICD-10-CM | POA: Diagnosis not present

## 2019-08-04 DIAGNOSIS — R627 Adult failure to thrive: Secondary | ICD-10-CM | POA: Diagnosis present

## 2019-08-04 DIAGNOSIS — R197 Diarrhea, unspecified: Secondary | ICD-10-CM | POA: Diagnosis not present

## 2019-08-04 DIAGNOSIS — I10 Essential (primary) hypertension: Secondary | ICD-10-CM | POA: Diagnosis present

## 2019-08-04 DIAGNOSIS — Z85048 Personal history of other malignant neoplasm of rectum, rectosigmoid junction, and anus: Secondary | ICD-10-CM | POA: Diagnosis not present

## 2019-08-04 DIAGNOSIS — R338 Other retention of urine: Secondary | ICD-10-CM | POA: Diagnosis not present

## 2019-08-04 DIAGNOSIS — I2510 Atherosclerotic heart disease of native coronary artery without angina pectoris: Secondary | ICD-10-CM | POA: Diagnosis present

## 2019-08-04 DIAGNOSIS — Z7901 Long term (current) use of anticoagulants: Secondary | ICD-10-CM

## 2019-08-04 DIAGNOSIS — E785 Hyperlipidemia, unspecified: Secondary | ICD-10-CM | POA: Diagnosis present

## 2019-08-04 DIAGNOSIS — E1042 Type 1 diabetes mellitus with diabetic polyneuropathy: Secondary | ICD-10-CM | POA: Diagnosis present

## 2019-08-04 DIAGNOSIS — N136 Pyonephrosis: Secondary | ICD-10-CM | POA: Diagnosis present

## 2019-08-04 DIAGNOSIS — E1029 Type 1 diabetes mellitus with other diabetic kidney complication: Secondary | ICD-10-CM | POA: Diagnosis present

## 2019-08-04 DIAGNOSIS — E871 Hypo-osmolality and hyponatremia: Secondary | ICD-10-CM | POA: Diagnosis present

## 2019-08-04 DIAGNOSIS — L97409 Non-pressure chronic ulcer of unspecified heel and midfoot with unspecified severity: Secondary | ICD-10-CM

## 2019-08-04 DIAGNOSIS — I824Z2 Acute embolism and thrombosis of unspecified deep veins of left distal lower extremity: Secondary | ICD-10-CM | POA: Diagnosis not present

## 2019-08-04 DIAGNOSIS — Z79899 Other long term (current) drug therapy: Secondary | ICD-10-CM

## 2019-08-04 DIAGNOSIS — E669 Obesity, unspecified: Secondary | ICD-10-CM | POA: Diagnosis present

## 2019-08-04 DIAGNOSIS — Z933 Colostomy status: Secondary | ICD-10-CM

## 2019-08-04 DIAGNOSIS — E1022 Type 1 diabetes mellitus with diabetic chronic kidney disease: Secondary | ICD-10-CM | POA: Diagnosis present

## 2019-08-04 DIAGNOSIS — E10649 Type 1 diabetes mellitus with hypoglycemia without coma: Secondary | ICD-10-CM | POA: Diagnosis not present

## 2019-08-04 DIAGNOSIS — Z90710 Acquired absence of both cervix and uterus: Secondary | ICD-10-CM

## 2019-08-04 DIAGNOSIS — Z90722 Acquired absence of ovaries, bilateral: Secondary | ICD-10-CM

## 2019-08-04 DIAGNOSIS — I890 Lymphedema, not elsewhere classified: Secondary | ICD-10-CM | POA: Diagnosis present

## 2019-08-04 HISTORY — DX: Type 2 diabetes mellitus without complications: E11.9

## 2019-08-04 LAB — CBC WITH DIFFERENTIAL/PLATELET
Abs Immature Granulocytes: 3.31 10*3/uL — ABNORMAL HIGH (ref 0.00–0.07)
Basophils Absolute: 0.1 10*3/uL (ref 0.0–0.1)
Basophils Relative: 0 %
Eosinophils Absolute: 0.1 10*3/uL (ref 0.0–0.5)
Eosinophils Relative: 1 %
HCT: 35.8 % — ABNORMAL LOW (ref 36.0–46.0)
Hemoglobin: 11.2 g/dL — ABNORMAL LOW (ref 12.0–15.0)
Immature Granulocytes: 17 %
Lymphocytes Relative: 9 %
Lymphs Abs: 1.7 10*3/uL (ref 0.7–4.0)
MCH: 28.3 pg (ref 26.0–34.0)
MCHC: 31.3 g/dL (ref 30.0–36.0)
MCV: 90.4 fL (ref 80.0–100.0)
Monocytes Absolute: 1.5 10*3/uL — ABNORMAL HIGH (ref 0.1–1.0)
Monocytes Relative: 8 %
Neutro Abs: 13.2 10*3/uL — ABNORMAL HIGH (ref 1.7–7.7)
Neutrophils Relative %: 65 %
Platelets: 478 10*3/uL — ABNORMAL HIGH (ref 150–400)
RBC: 3.96 MIL/uL (ref 3.87–5.11)
RDW: 15.3 % (ref 11.5–15.5)
Smear Review: NORMAL
WBC: 19.9 10*3/uL — ABNORMAL HIGH (ref 4.0–10.5)
nRBC: 0 % (ref 0.0–0.2)

## 2019-08-04 LAB — URINALYSIS, COMPLETE (UACMP) WITH MICROSCOPIC
Bilirubin Urine: NEGATIVE
Glucose, UA: >=500 mg/dL — AB
Ketones, ur: NEGATIVE mg/dL
Nitrite: POSITIVE — AB
Protein, ur: 100 mg/dL — AB
RBC / HPF: >50 RBC/hpf — ABNORMAL HIGH (ref 0–5)
Specific Gravity, Urine: 1.017 (ref 1.005–1.030)
WBC, UA: >50 WBC/hpf — ABNORMAL HIGH (ref 0–5)
pH: 6 (ref 5.0–8.0)

## 2019-08-04 LAB — PROTIME-INR
INR: 1.7 — ABNORMAL HIGH (ref 0.8–1.2)
Prothrombin Time: 20.1 seconds — ABNORMAL HIGH (ref 11.4–15.2)

## 2019-08-04 LAB — COMPREHENSIVE METABOLIC PANEL
ALT: 90 U/L — ABNORMAL HIGH (ref 0–44)
AST: 93 U/L — ABNORMAL HIGH (ref 15–41)
Albumin: 2.2 g/dL — ABNORMAL LOW (ref 3.5–5.0)
Alkaline Phosphatase: 183 U/L — ABNORMAL HIGH (ref 38–126)
Anion gap: 12 (ref 5–15)
BUN: 89 mg/dL — ABNORMAL HIGH (ref 8–23)
CO2: 12 mmol/L — ABNORMAL LOW (ref 22–32)
Calcium: 9.1 mg/dL (ref 8.9–10.3)
Chloride: 106 mmol/L (ref 98–111)
Creatinine, Ser: 3.1 mg/dL — ABNORMAL HIGH (ref 0.44–1.00)
GFR calc Af Amer: 17 mL/min — ABNORMAL LOW (ref >60)
GFR calc non Af Amer: 14 mL/min — ABNORMAL LOW (ref >60)
Glucose, Bld: 398 mg/dL — ABNORMAL HIGH (ref 70–99)
Potassium: 4.1 mmol/L (ref 3.5–5.1)
Sodium: 130 mmol/L — ABNORMAL LOW (ref 135–145)
Total Bilirubin: 0.6 mg/dL (ref 0.3–1.2)
Total Protein: 7.7 g/dL (ref 6.5–8.1)

## 2019-08-04 LAB — TYPE AND SCREEN
ABO/RH(D): O POS
Antibody Screen: NEGATIVE

## 2019-08-04 MED ORDER — SODIUM CHLORIDE 0.9 % IV BOLUS
500.0000 mL | Freq: Once | INTRAVENOUS | Status: AC
  Administered 2019-08-04: 18:00:00 500 mL via INTRAVENOUS

## 2019-08-04 MED ORDER — SODIUM CHLORIDE 0.9 % IV SOLN
2.0000 g | Freq: Once | INTRAVENOUS | Status: AC
  Administered 2019-08-05: 2 g via INTRAVENOUS
  Filled 2019-08-04: qty 20

## 2019-08-04 NOTE — ED Notes (Signed)
Report to Eugene Garnet, RN

## 2019-08-04 NOTE — ED Notes (Signed)
Lab contacted to re-draw pink top tube that clotted

## 2019-08-04 NOTE — ED Provider Notes (Signed)
[facility] Emergency Department Provider Note  ____________________________________________   First MD Initiated Contact with Patient 08/04/19 1608     (approximate)  I have reviewed the triage vital signs and the nursing notes.   HISTORY  Chief Complaint Vaginal Bleeding    HPI Anne George is a 74 y.o. female  With PMHx below including HTN, HLD, DM, DVT, recent COVID diagnosis, here with vaginal bleeding. Pt reports that for the past 2-3 weeks, she's had episodes when she stands up and experiences passage of a "significant" amount of blood per vagina or urethra. She states it just "falls out" as clots. She's been treated for a UTI recently for this at her facility w/o improvement. Denies any overt dysuria. She feels weak, mildly lightheaded, and drowsy. Reports h/o bleeding in urine and had a vesicovaginal fistula noted 2018 - has not had care since then. She has a h/o anal CA and pelvic radiation. No fever, chills. She is on ASA and Eliquis. No ongoing abd pain, only mild aching lower back pain which is chronic.        History reviewed. No pertinent past medical history.  Patient Active Problem List   Diagnosis Date Noted  . Generalized weakness 08/04/2019  . Hyperosmolar non-ketotic state in patient with type 2 diabetes mellitus ([place]) 07/14/2019  . DVT, lower extremity, proximal, acute, left ([place]) 07/14/2019  . COVID-19 virus infection 07/14/2019  . Acute renal failure superimposed on stage 4 chronic kidney disease ([place]) 07/14/2019  . Essential hypertension 07/14/2019  . FTT (failure to thrive) in adult 07/14/2019  . Obesity (BMI 30.0-34.9) 07/14/2019  . Skin ulcer of sacrum, limited to breakdown of skin ([place])   . Mild nonproliferative diabetic retinopathy of both eyes without macular edema associated with type 1 diabetes mellitus ([place]) 07/03/2018  . Cellulitis of left lower extremity 03/05/2018  . Venous stasis 11/29/2017  . Hyperopia with  astigmatism and presbyopia, bilateral 03/20/2017  . Chronic kidney disease (CKD) stage G4/A1, severely decreased glomerular filtration rate (GFR) between 15-29 mL/min/1.73 square meter and albuminuria creatinine ratio less than 30 mg/g ([facility]) 12/28/2016  . Coronary artery disease 08/22/2013  . Back pain 12/12/2012  . Hyperkalemia 03/15/2012  . Hypercholesterolemia 08/24/2011  . History of rectal cancer 05/23/2011  . Ovarian failure 03/18/2010  . Symptomatic menopausal or female climacteric states 03/18/2010  . Acquired spondylolisthesis 03/01/2010  . Edema 03/18/2006  . Peptic ulcer 05/15/1995  . Osteoarthrosis 08/13/1992  . 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 PERIPHERAL VASCULAR THROMBECTOMY Left 07/17/2019   Procedure: PERIPHERAL VASCULAR THROMBECTOMY / THROMBOLYSIS;  Surgeon: Algernon Huxley, MD;  Location: [facility];  Service: Cardiovascular;  Laterality: Left;    Prior to Admission medications   Medication Sig Start Date End Date Taking? Authorizing Provider  acetaminophen (TYLENOL) 650 MG CR tablet Take 650 mg by mouth every 8 (eight) hours as needed for pain.   Yes [provider]  Amino Acids-Protein Hydrolys (FEEDING SUPPLEMENT, PRO-STAT SUGAR FREE 64,) LIQD Take 30 mLs by mouth 3 (three) times daily with meals.   Yes [provider]  amLODipine (NORVASC) 2.5 MG tablet Take 2.5 mg by mouth daily. 05/12/19 05/11/20 Yes [provider]  apixaban (ELIQUIS) 5 MG TABS tablet Take 2 tablets (10mg ) twice daily for 7 days, then 1 tablet (5mg ) twice daily 07/21/19  Yes Lavina Hamman, MD  ascorbic acid (VITAMIN C) 500  MG tablet Take 500 mg by mouth 2 (two) times daily.   Yes [provider]  aspirin 81 MG EC tablet Take 81 mg by mouth daily. 02/02/16  Yes [provider]  atorvastatin (LIPITOR) 80 MG tablet Take 80 mg by mouth daily. 06/17/19  Yes  [provider]  Cholecalciferol 125 MCG (5000 UT) TABS Take 5,000 Units by mouth daily. 08/16/16  Yes [provider]  collagenase (SANTYL) ointment Apply topically daily. Patient taking differently: Apply 1 application topically 2 (two) times daily.  07/22/19  Yes Lavina Hamman, MD  furosemide (LASIX) 20 MG tablet Take 2 tablets (40 mg total) by mouth daily. Patient taking differently: Take 40 mg by mouth 2 (two) times daily.  07/22/19  Yes Lavina Hamman, MD  insulin aspart (NOVOLOG FLEXPEN) 100 UNIT/ML FlexPen INJECT BEFORE MEALS AND SNACKS 3 TO 5 TIMES DAILY AS NEEDED (2 UNITS FOR EVERY 15 GRAMS CARBS). PEN IN USE EXPIRES 28 DAYS 05/25/17  Yes [provider]  Insulin Glargine (LANTUS SOLOSTAR) 100 UNIT/ML Solostar Pen Inject 32 Units into the skin at bedtime.  07/26/18  Yes [provider]  metoprolol succinate (TOPROL-XL) 25 MG 24 hr tablet Take 25 mg by mouth daily. 06/18/19  Yes [provider]  Multiple Vitamins-Minerals (COMPLETE) TABS Take 1 tablet by mouth daily. 07/19/07  Yes [provider]  omeprazole (PRILOSEC) 20 MG capsule Take 20 mg by mouth daily. 06/18/19  Yes [provider]  saccharomyces boulardii (FLORASTOR) 250 MG capsule Take 250 mg by mouth 2 (two) times daily.   Yes [provider]  sodium bicarbonate 650 MG tablet Take 650 mg by mouth 2 (two) times daily. 04/24/19  Yes [provider]  venlafaxine XR (EFFEXOR-XR) 37.5 MG 24 hr capsule Take 2 capsules by mouth daily. 06/18/19  Yes [provider]  gabapentin (NEURONTIN) 100 MG capsule Take 300 mg by mouth 3 (three) times daily as needed. 06/18/19   [provider]  methocarbamol (ROBAXIN) 500 MG tablet Take 1 tablet (500 mg total) by mouth every 8 (eight) hours as needed for muscle spasms. 07/22/19   Lavina Hamman, MD    Allergies Patient has no known allergies.  History reviewed. No pertinent family history.  Social  History Social History   Tobacco Use  . Smoking status: Never Smoker  . Smokeless tobacco: Never Used  Substance Use Topics  . Alcohol use: Never  . Drug use: Never    Review of Systems  Review of Systems  Constitutional: Positive for fatigue. Negative for fever.  HENT: Negative for congestion and sore throat.   Eyes: Negative for visual disturbance.  Respiratory: Negative for cough and shortness of breath.   Cardiovascular: Negative for chest pain.  Gastrointestinal: Negative for abdominal pain, diarrhea, nausea and vomiting.  Genitourinary: Positive for vaginal bleeding. Negative for flank pain.  Musculoskeletal: Negative for back pain and neck pain.  Skin: Negative for rash and wound.  Neurological: Positive for weakness and light-headedness.  All other systems reviewed and are negative.    ____________________________________________  PHYSICAL EXAM:      VITAL SIGNS: ED Triage Vitals [08/04/19 1600]  Enc Vitals Group     BP      Pulse Rate 97     Resp 16     Temp 98.4 F (36.9 C)     Temp Source Oral     SpO2 99 %     Weight 160 lb (72.6 kg)  Height 4\' 10"  (1.473 m)     Head Circumference      Peak Flow      Pain Score 0     Pain Loc      Pain Edu?      Excl. in [place]?      Physical Exam Vitals and nursing note reviewed.  Constitutional:      General: She is not in acute distress.    Appearance: She is well-developed.  HENT:     Head: Normocephalic and atraumatic.     Mouth/Throat:     Mouth: Mucous membranes are dry.  Eyes:     Conjunctiva/sclera: Conjunctivae normal.  Cardiovascular:     Rate and Rhythm: Normal rate and regular rhythm.     Heart sounds: Normal heart sounds.  Pulmonary:     Effort: Pulmonary effort is normal. No respiratory distress.     Breath sounds: No wheezing.  Abdominal:     General: Abdomen is flat. There is no distension.     Tenderness: There is abdominal tenderness (mild suprapubic).  Genitourinary:    Comments:  Superficial mucosal ulceration and irritation of labia minora and vaginal introitus, marked TTP with introduction of speculum. No overt bleeding in vaginal vault at this time. Musculoskeletal:     Cervical back: Neck supple.  Skin:    General: Skin is warm.     Capillary Refill: Capillary refill takes less than 2 seconds.     Findings: No rash.  Neurological:     Mental Status: She is alert and oriented to person, place, and time.     Motor: No abnormal muscle tone.       ____________________________________________   LABS (all labs ordered are listed, but only abnormal results are displayed)  Labs Reviewed  CBC WITH DIFFERENTIAL/PLATELET - Abnormal; Notable for the following components:      Result Value   WBC 19.9 (*)    Hemoglobin 11.2 (*)    HCT 35.8 (*)    Platelets 478 (*)    Neutro Abs 13.2 (*)    Monocytes Absolute 1.5 (*)    Abs Immature Granulocytes 3.31 (*)    All other components within normal limits  COMPREHENSIVE METABOLIC PANEL - Abnormal; Notable for the following components:   Sodium 130 (*)    CO2 12 (*)    Glucose, Bld 398 (*)    BUN 89 (*)    Creatinine, Ser 3.10 (*)    Albumin 2.2 (*)    AST 93 (*)    ALT 90 (*)    Alkaline Phosphatase 183 (*)    GFR calc non Af Amer 14 (*)    GFR calc Af Amer 17 (*)    All other components within normal limits  PROTIME-INR - Abnormal; Notable for the following components:   Prothrombin Time 20.1 (*)    INR 1.7 (*)    All other components within normal limits  URINALYSIS, COMPLETE (UACMP) WITH MICROSCOPIC - Abnormal; Notable for the following components:   Color, Urine AMBER (*)    APPearance CLOUDY (*)    Glucose, UA >=500 (*)    Hgb urine dipstick LARGE (*)    Protein, ur 100 (*)    Nitrite POSITIVE (*)    Leukocytes,Ua LARGE (*)    RBC / HPF >50 (*)    WBC, UA >50 (*)    Bacteria, UA RARE (*)    All other components within normal limits  URINE CULTURE  TYPE AND SCREEN  TYPE AND SCREEN     ____________________________________________  EKG: Normal sinus rhythm, VR 93. QRS 85, QTc 432. No acute ST elevations or depressions. No ischemia or infarct.  ________________________________________  RADIOLOGY All imaging, including plain films, CT scans, and ultrasounds, independently reviewed by me, and interpretations confirmed via formal radiology reads.  ED MD interpretation:   CXR: No acute abnormality  Official radiology report(s): DG Chest Portable 1 View  Result Date: 08/04/2019 CLINICAL DATA:  Weakness EXAM: PORTABLE CHEST 1 VIEW COMPARISON:  07/14/2019 FINDINGS: The heart size and mediastinal contours are within normal limits. Aortic atherosclerosis. Both lungs are clear. The visualized skeletal structures are unremarkable. IMPRESSION: No active disease. Electronically Signed   By: Donavan Foil M.D.   On: 08/04/2019 17:14    ____________________________________________  PROCEDURES   Procedure(s) performed (including Critical Care):  Procedures  ____________________________________________  INITIAL IMPRESSION / MDM / [place] / ED COURSE  As part of my medical decision making, I reviewed the following data within the [facility] notes reviewed and incorporated, Old chart reviewed, Notes from prior ED visits, and [place] Controlled Substance [place] was evaluated in Emergency Department on 08/05/2019 for the symptoms described in the history of present illness. She was evaluated in the context of the global COVID-19 pandemic, which necessitated consideration that the patient might be at risk for infection with the SARS-CoV-2 virus that causes COVID-19. Institutional protocols and algorithms that pertain to the evaluation of patients at risk for COVID-19 are in a state of rapid change based on information released by regulatory bodies including the CDC and federal and state organizations. These policies and  algorithms were followed during the patient's care in the ED.  Some ED evaluations and interventions may be delayed as a result of limited staffing during the pandemic.*  Clinical Course as of Aug 04 133  Mon Aug 04, 2019  1650 74 yo F here with generalized weakness, vaginal bleeding. Recent COVID diagnosis, AKI, and diagnosis of UTI on Augmentin. Labs show worsening leukocytosis and likely acute on chronic kidney injury, suspected pre-renal 2/2 poor PO intake. IVF given. Hgb is stable, which is reassuring given reported bleeding. Will check UA.   [CI]  1829 UA pending.  Gentle IV fluids running.   [CI]  1943 Patient still has not urinated. Will try in and out.   [CI]  2010 Urinalysis finally obtained and is pending.  Will likely plan to admit for worsening leukocytosis, acute kidney injury, and suspected UTI.   [CI]  2036 I have called lab twice and re-sent urine, without s   [CI]    Clinical Course User Index [CI] Duffy Bruce, MD    Medical Decision Making:  As above. Admit for UTI with generalized weakness, acute on chronic kidney injury, ? Vaginal bleeding. May benefit from OB C/s if bleeding persists.  ____________________________________________  FINAL CLINICAL IMPRESSION(S) / ED DIAGNOSES  Final diagnoses:  Gross hematuria  Acute cystitis with hematuria  Acute renal failure superimposed on stage 4 chronic kidney disease, unspecified acute renal failure type ([place])     MEDICATIONS GIVEN DURING THIS VISIT:  Medications  cefTRIAXone (ROCEPHIN) 2 g in sodium chloride 0.9 % 100 mL IVPB (2 g Intravenous New Bag/Given 08/05/19 0129)  sodium chloride 0.9 % bolus 500 mL (0 mLs Intravenous Stopped 08/04/19 2134)     ED Discharge Orders    None       Note:  This  document was prepared using Systems analyst and may include unintentional dictation errors.   Duffy Bruce, MD 08/05/19 [phone]

## 2019-08-04 NOTE — ED Notes (Signed)
Lab at bedside to draw repeat pink top

## 2019-08-04 NOTE — H&P (Signed)
History and Physical   [place] [id number] DOB: January 01, 1946 DOA: 08/04/2019  Referring MD/NP/PA: Dr. Lysbeth Galas  PCP: Joseph Pierini, MD   Outpatient Specialists: [facility] outpatient cardiology and nephrology  Patient coming from: Home  Chief Complaint: Weakness and vaginal bleed  HPI: Anne George is a 74 y.o. female with medical history significant of coronary artery disease, hypertension, hyperlipidemia, type 1 diabetes, recent DVT with vascular intervention, recent COVID-19 infection about a month ago with no active symptoms, who was recently admitted to the hospital and discharged about 2 weeks ago.  Came back today complaining of generalized weakness and noticing some blood clot suspected to be from vaginal.  She was seen and evaluated in the ER.  She mentioned the bleeding happens when she stands up.  It just.  Lateral leg clots.  Patient has had recurrent UTIs and urinalysis now indicated significant UTI.  She has been weak and debilitated palpation since previous discharge.  Not able to get out of bed easily.  She denied any fever or chills.  Patient had previous history of anal cancer with pelvic radiation and suspicious for possible lesions in the perianal area leading to the bleed.  She takes Eliquis for her DVT and also aspirin.  At this point she has no evidence of active bleeding.  H&H also appears relatively stable.  She is being admitted with generalized weakness most likely due to recurrent UTI..  ED Course: Temperature 98 for blood pressure 127/44 pulse 97 respirate of 17 oxygen sats 100% on room air.  Sodium is 130 potassium 4.2 chloride 106 CO2 of 12 glucose 398 creatinine 3.10 was BUN of 89.  Alkaline phosphatase 183.  White count is 19.9 hemoglobin 11.1 platelets 478.  PT 20.1 INR 1.7.  Chest x-ray showed no acute findings.  Patient being admitted to the hospital after initiation of IV Rocephin  Review of Systems: As per HPI otherwise 10 point review of systems  negative.    History reviewed. No pertinent past medical history.  Past Surgical History:  Procedure Laterality Date  . PERIPHERAL VASCULAR THROMBECTOMY Left 07/17/2019   Procedure: PERIPHERAL VASCULAR THROMBECTOMY / THROMBOLYSIS;  Surgeon: Algernon Huxley, MD;  Location: [place] [facility];  Service: Cardiovascular;  Laterality: Left;     reports that she has never smoked. She has never used smokeless tobacco. She reports that she does not drink alcohol or use drugs.  No Known Allergies  History reviewed. No pertinent family history.   Prior to Admission medications   Medication Sig Start Date End Date Taking? Authorizing Provider  acetaminophen (TYLENOL) 650 MG CR tablet Take 650 mg by mouth every 8 (eight) hours as needed for pain.   Yes [provider]  Amino Acids-Protein Hydrolys (FEEDING SUPPLEMENT, PRO-STAT SUGAR FREE 64,) LIQD Take 30 mLs by mouth 3 (three) times daily with meals.   Yes [provider]  amLODipine (NORVASC) 2.5 MG tablet Take 2.5 mg by mouth daily. 05/12/19 05/11/20 Yes [provider]  apixaban (ELIQUIS) 5 MG TABS tablet Take 2 tablets (10mg ) twice daily for 7 days, then 1 tablet (5mg ) twice daily 07/21/19  Yes Lavina Hamman, MD  ascorbic acid (VITAMIN C) 500 MG tablet Take 500 mg by mouth 2 (two) times daily.   Yes [provider]  aspirin 81 MG EC tablet Take 81 mg by mouth daily. 02/02/16  Yes [provider]  atorvastatin (LIPITOR) 80 MG tablet Take 80 mg by mouth daily. 06/17/19  Yes [provider]  Cholecalciferol 125 MCG (5000 UT) TABS Take 5,000 Units by mouth daily. 08/16/16  Yes [provider]  collagenase (SANTYL) ointment Apply topically daily. Patient taking differently: Apply 1 application topically 2 (two) times daily.  07/22/19  Yes Lavina Hamman, MD  furosemide (LASIX) 20 MG tablet Take 2 tablets (40 mg total) by mouth daily. Patient taking differently: Take 40 mg by mouth 2 (two)  times daily.  07/22/19  Yes Lavina Hamman, MD  insulin aspart (NOVOLOG FLEXPEN) 100 UNIT/ML FlexPen INJECT BEFORE MEALS AND SNACKS 3 TO 5 TIMES DAILY AS NEEDED (2 UNITS FOR EVERY 15 GRAMS CARBS). PEN IN USE EXPIRES 28 DAYS 05/25/17  Yes [provider]  Insulin Glargine (LANTUS SOLOSTAR) 100 UNIT/ML Solostar Pen Inject 32 Units into the skin at bedtime.  07/26/18  Yes [provider]  metoprolol succinate (TOPROL-XL) 25 MG 24 hr tablet Take 25 mg by mouth daily. 06/18/19  Yes [provider]  Multiple Vitamins-Minerals (COMPLETE) TABS Take 1 tablet by mouth daily. 07/19/07  Yes [provider]  omeprazole (PRILOSEC) 20 MG capsule Take 20 mg by mouth daily. 06/18/19  Yes [provider]  saccharomyces boulardii (FLORASTOR) 250 MG capsule Take 250 mg by mouth 2 (two) times daily.   Yes [provider]  sodium bicarbonate 650 MG tablet Take 650 mg by mouth 2 (two) times daily. 04/24/19  Yes [provider]  venlafaxine XR (EFFEXOR-XR) 37.5 MG 24 hr capsule Take 2 capsules by mouth daily. 06/18/19  Yes [provider]  gabapentin (NEURONTIN) 100 MG capsule Take 300 mg by mouth 3 (three) times daily as needed. 06/18/19   [provider]  methocarbamol (ROBAXIN) 500 MG tablet Take 1 tablet (500 mg total) by mouth every 8 (eight) hours as needed for muscle spasms. 07/22/19   Lavina Hamman, MD    Physical Exam: Vitals:   08/04/19 1600  BP: 117/63  Pulse: 97  Resp: 16  Temp: 98.4 F (36.9 C)  TempSrc: Oral  SpO2: 99%  Weight: 72.6 kg  Height: 4\' 10"  (1.473 m)      Constitutional: Weak, frail no acute distress Vitals:   08/04/19 1600  BP: 117/63  Pulse: 97  Resp: 16  Temp: 98.4 F (36.9 C)  TempSrc: Oral  SpO2: 99%  Weight: 72.6 kg  Height: 4\' 10"  (1.473 m)   Eyes: PERRL, lids and conjunctivae normal ENMT: Mucous membranes are dry. Posterior pharynx clear of any exudate or lesions.Normal dentition.  Neck:  normal, supple, no masses, no thyromegaly Respiratory: clear to auscultation bilaterally, no wheezing, no crackles. Normal respiratory effort. No accessory muscle use.  Cardiovascular: Regular rate and rhythm, no murmurs / rubs / gallops. No extremity edema. 2+ pedal pulses. No carotid bruits.  Abdomen: no tenderness, no masses palpated. No hepatosplenomegaly. Bowel sounds positive.  Musculoskeletal: no clubbing / cyanosis. No joint deformity upper and lower extremities. Good ROM, no contractures. Normal muscle tone.  Skin: no rashes, lesions, ulcers. No induration Neurologic: CN 2-12 grossly intact. Sensation intact, DTR normal. Strength 5/5 in all 4.  Psychiatric: Mild confusion, arousable and communicative    Labs on Admission: I have personally reviewed following labs and imaging studies  CBC: Recent Labs  Lab 08/04/19 1613  WBC 19.9*  NEUTROABS 13.2*  HGB 11.2*  HCT 35.8*  MCV 90.4  PLT 123456*   Basic Metabolic Panel: Recent Labs  Lab 08/04/19 1613  NA 130*  K 4.1  CL 106  CO2 12*  GLUCOSE 398*  BUN 89*  CREATININE 3.10*  CALCIUM 9.1   GFR: Estimated Creatinine Clearance: 13.9 mL/min (A) (by C-G formula based on SCr of 3.1 mg/dL (H)). Liver Function Tests: Recent Labs  Lab 08/04/19 1613  AST 93*  ALT 90*  ALKPHOS 183*  BILITOT 0.6  PROT 7.7  ALBUMIN 2.2*   No results for input(s): LIPASE, AMYLASE in the last 168 hours. No results for input(s): AMMONIA in the last 168 hours. Coagulation Profile: Recent Labs  Lab 08/04/19 1613  INR 1.7*   Cardiac Enzymes: No results for input(s): CKTOTAL, CKMB, CKMBINDEX, TROPONINI in the last 168 hours. BNP (last 3 results) No results for input(s): PROBNP in the last 8760 hours. HbA1C: No results for input(s): HGBA1C in the last 72 hours. CBG: No results for input(s): GLUCAP in the last 168 hours. Lipid Profile: No results for input(s): CHOL, HDL, LDLCALC, TRIG, CHOLHDL, LDLDIRECT in the last 72 hours. Thyroid  Function Tests: No results for input(s): TSH, T4TOTAL, FREET4, T3FREE, THYROIDAB in the last 72 hours. Anemia Panel: No results for input(s): VITAMINB12, FOLATE, FERRITIN, TIBC, IRON, RETICCTPCT in the last 72 hours. Urine analysis:    Component Value Date/Time   COLORURINE AMBER (A) 08/04/2019 2134   APPEARANCEUR CLOUDY (A) 08/04/2019 2134   LABSPEC 1.017 08/04/2019 2134   PHURINE 6.0 08/04/2019 2134   GLUCOSEU >=500 (A) 08/04/2019 2134   HGBUR LARGE (A) 08/04/2019 2134   BILIRUBINUR NEGATIVE 08/04/2019 2134   [place] NEGATIVE 08/04/2019 2134   PROTEINUR 100 (A) 08/04/2019 2134   NITRITE POSITIVE (A) 08/04/2019 2134   LEUKOCYTESUR LARGE (A) 08/04/2019 2134   Sepsis Labs: @LABRCNTIP (procalcitonin:4,lacticidven:4) )No results found for this or any previous visit (from the past 240 hour(s)).   Radiological Exams on Admission: DG Chest Portable 1 View  Result Date: 08/04/2019 CLINICAL DATA:  Weakness EXAM: PORTABLE CHEST 1 VIEW COMPARISON:  07/14/2019 FINDINGS: The heart size and mediastinal contours are within normal limits. Aortic atherosclerosis. Both lungs are clear. The visualized skeletal structures are unremarkable. IMPRESSION: No active disease. Electronically Signed   By: Donavan Foil M.D.   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  Most likely UTI.  We will admit the patient and treat for the UTI.  Supportive care will be given.  PT and OT consultation.  #2 UTI: Based on urinalysis.  She has positive nitrite, leukocytes as well as symptoms of dysuria.  Patient will be initiated on IV Rocephin.  Urine and blood cultures obtained and will follow results  #3 type 1 diabetes:  Insulin-dependent.  Has chronic kidney disease from the diabetes.  Continue sliding scale insulin with home long-acting insulin  #4 acute on chronic kidney disease: Patient has baseline creatinine in the twos but currently above 3.  She has chronic kidney disease stage IV.  She appears to have acute exacerbation and worsening.  Gentle hydration and follow closely  #5 essential hypertension: Continue home regimen.  #6 coronary artery disease: Stable with no acute decompensation.  #7 recent COVID-19 infection: Patient outside the infectious..  Continue close monitoring asymptomatic at this point.  #8 suspected vaginal bleed: No evidence of bleed at this point.  Patient will be monitored and if any bleeding  recurs OB/GYN will be consulted  #9 hyponatremia: Appears to be chronic.  Sodium is still 130.  Hydrate and monitor.  DVT prophylaxis: Apixaban Code Status: Full code Family Communication: Discussed care with patient no family around Disposition Plan: Home Consults called: None Admission status: Inpatient  Severity of Illness: The appropriate patient status for this patient is INPATIENT. Inpatient status is judged to be reasonable and necessary in order to provide the required intensity of service to ensure the patient's safety. The patient's presenting symptoms, physical exam findings, and initial radiographic and laboratory data in the context of their chronic comorbidities is felt to place them at high risk for further clinical deterioration. Furthermore, it is not anticipated that the patient will be medically stable for discharge from the hospital within 2 midnights of admission. The following factors support the patient status of inpatient.   " The patient's presenting symptoms include generalized weakness. " The worrisome physical exam findings include frail but no acute distress. " The initial radiographic and laboratory data are worrisome because of evidence of UTI on  urinalysis. " The chronic co-morbidities include coronary artery disease with recent COVID-19 infection.   * I certify that at the point of admission it is my clinical judgment that the patient will require inpatient hospital care spanning beyond 2 midnights from the point of admission due to high intensity of service, high risk for further deterioration and high frequency of surveillance required.Barbette Merino MD [facility] Pager [phone]  If 7PM-7AM, please contact night-coverage [email or web address] Password [facility]  08/04/2019, 10:37 PM

## 2019-08-04 NOTE — Progress Notes (Signed)
SUBJECTIVE:  Patient ID: Anne George, female    DOB: [birth year], 74 y.o.   MRN: [id number] Chief Complaint  Patient presents with  . Follow-up    [facility] 1 week left le dvt    HPI  Anne George is a 74 y.o. female that returns today following a left lower extremity thrombectomy on 07/16/2018.  Patient tolerated the procedure well.  There was no IVC filter placed during this procedure.  There was however stent placement to the left external iliac vein.  There was also thrombectomy of the left popliteal, superficial femoral, common femoral and iliac veins.  Today the patient continues to endorse having some edema of her bilateral lower extremities however the patient is dependent for extended period of time.  She denies any fever, chills, nausea, vomiting or diarrhea.  No issues with her catheter insertion site.  Today noninvasive study showed the patient has no evidence of DVT in the left lower extremity.  There is also no evidence of superficial venous stenosis.  The studies are improved from the previous studies.  History reviewed. No pertinent past medical history.  Past Surgical History:  Procedure Laterality Date  . PERIPHERAL VASCULAR THROMBECTOMY Left 07/17/2019   Procedure: PERIPHERAL VASCULAR THROMBECTOMY / THROMBOLYSIS;  Surgeon: Algernon Huxley, MD;  Location: [place] [facility];  Service: Cardiovascular;  Laterality: Left;    Social History   Socioeconomic History  . Marital status: Widowed    Spouse name: Not on file  . Number of children: Not on file  . Years of education: Not on file  . Highest education level: Not on file  Occupational History  . Not on file  Tobacco Use  . Smoking status: Never Smoker  . Smokeless tobacco: Never Used  Substance and Sexual Activity  . Alcohol use: Not on file  . Drug use: Not on file  . Sexual activity: Not on file  Other Topics Concern  . Not on file  Social History Narrative  . Not on file   Social Determinants of  Health   Financial Resource Strain:   . Difficulty of Paying Living Expenses: Not on file  Food Insecurity:   . Worried About Charity fundraiser in the Last Year: Not on file  . Ran Out of Food in the Last Year: Not on file  Transportation Needs:   . Lack of Transportation (Medical): Not on file  . Lack of Transportation (Non-Medical): Not on file  Physical Activity:   . Days of Exercise per Week: Not on file  . Minutes of Exercise per Session: Not on file  Stress:   . Feeling of Stress : Not on file  Social Connections:   . Frequency of Communication with Friends and Family: Not on file  . Frequency of Social Gatherings with Friends and Family: Not on file  . Attends Religious Services: Not on file  . Active Member of Clubs or Organizations: Not on file  . Attends Archivist Meetings: Not on file  . Marital Status: Not on file  Intimate Partner Violence:   . Fear of Current or Ex-Partner: Not on file  . Emotionally Abused: Not on file  . Physically Abused: Not on file  . Sexually Abused: Not on file    History reviewed. No pertinent family history.  No Known Allergies   Review of Systems   Review of Systems: Negative Unless Checked Constitutional: [] Weight loss  [] Fever  [] Chills Cardiac: [] Chest pain   []   Atrial Fibrillation  [] Palpitations   [] Shortness of breath when laying flat   [] Shortness of breath with exertion. [] Shortness of breath at rest Vascular:  [] Pain in legs with walking   [] Pain in legs with standing [] Pain in legs when laying flat   [] Claudication    [] Pain in feet when laying flat    [x] History of DVT   [] Phlebitis   [x] Swelling in legs   [] Varicose veins   [x] Non-healing ulcers Pulmonary:   [] Uses home oxygen   [] Productive cough   [] Hemoptysis   [] Wheeze  [] COPD   [] Asthma Neurologic:  [] Dizziness   [] Seizures  [] Blackouts [] History of stroke   [] History of TIA  [] Aphasia   [] Temporary Blindness   [] Weakness or numbness in arm   [] Weakness or  numbness in leg Musculoskeletal:   [] Joint swelling   [] Joint pain   [] Low back pain  []  History of Knee Replacement [x] Arthritis [] back Surgeries  []  Spinal Stenosis    Hematologic:  [] Easy bruising  [] Easy bleeding   [] Hypercoagulable state   [] Anemic Gastrointestinal:  [] Diarrhea   [] Vomiting  [] Gastroesophageal reflux/heartburn   [] Difficulty swallowing. [] Abdominal pain Genitourinary:  [] Chronic kidney disease   [] Difficult urination  [] Anuric   [] Blood in urine [] Frequent urination  [] Burning with urination   [] Hematuria Skin:  [] Rashes   [] Ulcers [] Wounds Psychological:  [] History of anxiety   []  History of major depression  []  Memory Difficulties      OBJECTIVE:   Physical Exam  BP (!) 122/53 (BP Location: Right Arm)   Pulse 81   Resp 16   Gen: WD/WN, NAD Head: [place]/AT, No temporalis wasting.  Ear/Nose/Throat: Hearing grossly intact, nares w/o erythema or drainage Eyes: PER, EOMI, sclera nonicteric.  Neck: Supple, no masses.  No JVD.  Pulmonary:  Good air movement, no use of accessory muscles.  Cardiac: RRR Vascular:  3+ edema bilaterally Vessel Right Left  Radial Palpable Palpable   Gastrointestinal: soft, non-distended. No guarding/no peritoneal signs.  Musculoskeletal: Wheelchair-bound.  No deformity or atrophy.  Neurologic: Pain and light touch intact in extremities.  Symmetrical.  Speech is fluent. Motor exam as listed above. Psychiatric: Judgment intact, Mood & affect appropriate for pt's clinical situation. Dermatologic: No Venous rashes. No Ulcers Noted.  No changes consistent with cellulitis. Lymph : No Cervical lymphadenopathy, no lichenification or skin changes of chronic lymphedema.       ASSESSMENT AND PLAN:  1. DVT, lower extremity, proximal, acute, left ([place]) Today patient has no evidence of DVT on noninvasive studies.  The patient continues to have lower extremity edema however this is being addressed by her home health facility.  Patient is advised to  continue with compression stockings and elevation.  Patient will follow-up in 3 months with evaluation of the iliac stent.  2. Essential hypertension Continue antihypertensive medications as already ordered, these medications have been reviewed and there are no changes at this time.    Current Outpatient Medications on File Prior to Visit  Medication Sig Dispense Refill  . amLODipine (NORVASC) 2.5 MG tablet Take 2.5 mg by mouth daily.    Marland Kitchen apixaban (ELIQUIS) 5 MG TABS tablet Take 2 tablets (10mg ) twice daily for 7 days, then 1 tablet (5mg ) twice daily 60 tablet 0  . aspirin 81 MG EC tablet Take 81 mg by mouth daily.    Marland Kitchen atorvastatin (LIPITOR) 80 MG tablet Take 80 mg by mouth daily.    . Cholecalciferol 125 MCG (5000 UT) TABS Take 5,000 Units by mouth daily.    Marland Kitchen  collagenase (SANTYL) ointment Apply topically daily. 15 g 0  . furosemide (LASIX) 20 MG tablet Take 2 tablets (40 mg total) by mouth daily. 30 tablet 0  . gabapentin (NEURONTIN) 100 MG capsule Take 300 mg by mouth 3 (three) times daily as needed.    . insulin aspart (NOVOLOG FLEXPEN) 100 UNIT/ML FlexPen INJECT BEFORE MEALS AND SNACKS 3 TO 5 TIMES DAILY AS NEEDED (2 UNITS FOR EVERY 15 GRAMS CARBS). PEN IN USE EXPIRES 28 DAYS    . Insulin Glargine (LANTUS SOLOSTAR) 100 UNIT/ML Solostar Pen INJECT  28 UNITS SUBCUTANEOUSLY EVERY NIGHT    . methocarbamol (ROBAXIN) 500 MG tablet Take 1 tablet (500 mg total) by mouth every 8 (eight) hours as needed for muscle spasms. 30 tablet 0  . metoprolol succinate (TOPROL-XL) 25 MG 24 hr tablet Take 25 mg by mouth daily.    . Multiple Vitamins-Minerals (COMPLETE) TABS Take 1 tablet by mouth daily.    Marland Kitchen omeprazole (PRILOSEC) 20 MG capsule Take 20 mg by mouth daily.    Marland Kitchen saccharomyces boulardii (FLORASTOR) 250 MG capsule Take 250 mg by mouth 2 (two) times daily.    . sodium bicarbonate 650 MG tablet Take 650 mg by mouth 2 (two) times daily.    Marland Kitchen venlafaxine XR (EFFEXOR-XR) 37.5 MG 24 hr capsule Take 2  capsules by mouth daily.     No current facility-administered medications on file prior to visit.    There are no Patient Instructions on file for this visit. No follow-ups on file.   Kris Hartmann, NP  This note was completed with Sales executive.  Any errors are purely unintentional.

## 2019-08-04 NOTE — ED Triage Notes (Addendum)
Pt to Ed via [facility] from [facility]. Pt c/o vaginal hemorrhage x3wks. Per EMS pt states doctors at [facility] believe it is a UTI and pt has been on anx for x1wk. EMS unable to verify information with [facility] staff.   Pt states she has considerable amount of vaginal bleeding everyday and is worse upon standing. Pt A&Ox4. Pt states she is on blood thinners, ASA and eliquis.   Pt on contact precautions for lice.

## 2019-08-05 ENCOUNTER — Encounter: Payer: Medicare PPO | Admitting: Occupational Therapy

## 2019-08-05 ENCOUNTER — Encounter: Payer: Self-pay | Admitting: Internal Medicine

## 2019-08-05 LAB — COMPREHENSIVE METABOLIC PANEL
ALT: 67 U/L — ABNORMAL HIGH (ref 0–44)
AST: 47 U/L — ABNORMAL HIGH (ref 15–41)
Albumin: 2 g/dL — ABNORMAL LOW (ref 3.5–5.0)
Alkaline Phosphatase: 159 U/L — ABNORMAL HIGH (ref 38–126)
Anion gap: 14 (ref 5–15)
BUN: 93 mg/dL — ABNORMAL HIGH (ref 8–23)
CO2: 11 mmol/L — ABNORMAL LOW (ref 22–32)
Calcium: 8.9 mg/dL (ref 8.9–10.3)
Chloride: 109 mmol/L (ref 98–111)
Creatinine, Ser: 2.75 mg/dL — ABNORMAL HIGH (ref 0.44–1.00)
GFR calc Af Amer: 19 mL/min — ABNORMAL LOW (ref >60)
GFR calc non Af Amer: 17 mL/min — ABNORMAL LOW (ref >60)
Glucose, Bld: 435 mg/dL — ABNORMAL HIGH (ref 70–99)
Potassium: 3.9 mmol/L (ref 3.5–5.1)
Sodium: 134 mmol/L — ABNORMAL LOW (ref 135–145)
Total Bilirubin: 0.5 mg/dL (ref 0.3–1.2)
Total Protein: 7.1 g/dL (ref 6.5–8.1)

## 2019-08-05 LAB — BASIC METABOLIC PANEL
Anion gap: 12 (ref 5–15)
BUN: 87 mg/dL — ABNORMAL HIGH (ref 8–23)
CO2: 12 mmol/L — ABNORMAL LOW (ref 22–32)
Calcium: 8.8 mg/dL — ABNORMAL LOW (ref 8.9–10.3)
Chloride: 109 mmol/L (ref 98–111)
Creatinine, Ser: 2.78 mg/dL — ABNORMAL HIGH (ref 0.44–1.00)
GFR calc Af Amer: 19 mL/min — ABNORMAL LOW (ref >60)
GFR calc non Af Amer: 16 mL/min — ABNORMAL LOW (ref >60)
Glucose, Bld: 192 mg/dL — ABNORMAL HIGH (ref 70–99)
Potassium: 2.8 mmol/L — ABNORMAL LOW (ref 3.5–5.1)
Sodium: 133 mmol/L — ABNORMAL LOW (ref 135–145)

## 2019-08-05 LAB — CBC
HCT: 31.6 % — ABNORMAL LOW (ref 36.0–46.0)
Hemoglobin: 9.7 g/dL — ABNORMAL LOW (ref 12.0–15.0)
MCH: 28 pg (ref 26.0–34.0)
MCHC: 30.7 g/dL (ref 30.0–36.0)
MCV: 91.3 fL (ref 80.0–100.0)
Platelets: 439 10*3/uL — ABNORMAL HIGH (ref 150–400)
RBC: 3.46 MIL/uL — ABNORMAL LOW (ref 3.87–5.11)
RDW: 15.4 % (ref 11.5–15.5)
WBC: 18 10*3/uL — ABNORMAL HIGH (ref 4.0–10.5)
nRBC: 0.1 % (ref 0.0–0.2)

## 2019-08-05 LAB — GLUCOSE, CAPILLARY
Glucose-Capillary: 418 mg/dL — ABNORMAL HIGH (ref 70–99)
Glucose-Capillary: 300 mg/dL — ABNORMAL HIGH (ref 70–99)
Glucose-Capillary: 125 mg/dL — ABNORMAL HIGH (ref 70–99)
Glucose-Capillary: 169 mg/dL — ABNORMAL HIGH (ref 70–99)

## 2019-08-05 LAB — MAGNESIUM: Magnesium: 1.2 mg/dL — ABNORMAL LOW (ref 1.7–2.4)

## 2019-08-05 LAB — BETA-HYDROXYBUTYRIC ACID: Beta-Hydroxybutyric Acid: 0.25 mmol/L (ref 0.05–0.27)

## 2019-08-05 LAB — LACTIC ACID, PLASMA
Lactic Acid, Venous: 2 mmol/L (ref 0.5–1.9)
Lactic Acid, Venous: 2.3 mmol/L (ref 0.5–1.9)

## 2019-08-05 MED ORDER — APIXABAN 2.5 MG PO TABS
2.5000 mg | ORAL_TABLET | Freq: Two times a day (BID) | ORAL | Status: DC
  Administered 2019-08-05 – 2019-08-06 (×3): 2.5 mg via ORAL
  Filled 2019-08-05 (×5): qty 1

## 2019-08-05 MED ORDER — ONDANSETRON HCL 4 MG/2ML IJ SOLN: 4.0000 mg | Freq: Four times a day (QID) | INTRAMUSCULAR | Status: DC | PRN

## 2019-08-05 MED ORDER — VENLAFAXINE HCL ER 75 MG PO CP24
75.0000 mg | ORAL_CAPSULE | Freq: Every day | ORAL | Status: DC
  Administered 2019-08-05 – 2019-08-11 (×6): 75 mg via ORAL
  Filled 2019-08-05 (×7): qty 1

## 2019-08-05 MED ORDER — SODIUM CHLORIDE 0.9 % IV SOLN
1.0000 g | INTRAVENOUS | Status: DC
  Administered 2019-08-05 – 2019-08-07 (×3): 1 g via INTRAVENOUS
  Filled 2019-08-05 (×3): qty 10
  Filled 2019-08-05: qty 1

## 2019-08-05 MED ORDER — METHOCARBAMOL 500 MG PO TABS
500.0000 mg | ORAL_TABLET | Freq: Three times a day (TID) | ORAL | Status: DC | PRN
  Filled 2019-08-05: qty 1

## 2019-08-05 MED ORDER — INSULIN ASPART 100 UNIT/ML ~~LOC~~ SOLN: 0.0000 [IU] | Freq: Three times a day (TID) | SUBCUTANEOUS | Status: DC

## 2019-08-05 MED ORDER — ASPIRIN EC 81 MG PO TBEC
81.0000 mg | DELAYED_RELEASE_TABLET | Freq: Every day | ORAL | Status: DC
  Administered 2019-08-05 – 2019-08-06 (×2): 81 mg via ORAL
  Filled 2019-08-05 (×2): qty 1

## 2019-08-05 MED ORDER — ADULT MULTIVITAMIN W/MINERALS CH
1.0000 | ORAL_TABLET | Freq: Every day | ORAL | Status: DC
  Administered 2019-08-05 – 2019-08-11 (×6): 1 via ORAL
  Filled 2019-08-05 (×6): qty 1

## 2019-08-05 MED ORDER — PRO-STAT SUGAR FREE PO LIQD
30.0000 mL | Freq: Three times a day (TID) | ORAL | Status: DC
  Administered 2019-08-06 – 2019-08-11 (×9): 30 mL via ORAL

## 2019-08-05 MED ORDER — SODIUM CHLORIDE 0.9 % IV BOLUS
1000.0000 mL | Freq: Once | INTRAVENOUS | Status: AC
  Administered 2019-08-05: 16:00:00 1000 mL via INTRAVENOUS

## 2019-08-05 MED ORDER — POTASSIUM CHLORIDE CRYS ER 20 MEQ PO TBCR
40.0000 meq | EXTENDED_RELEASE_TABLET | ORAL | Status: AC
  Administered 2019-08-05 (×2): 40 meq via ORAL
  Filled 2019-08-05 (×2): qty 2

## 2019-08-05 MED ORDER — VITAMIN D3 25 MCG (1000 UNIT) PO TABS
5000.0000 [IU] | ORAL_TABLET | Freq: Every day | ORAL | Status: DC
  Administered 2019-08-05 – 2019-08-11 (×6): 5000 [IU] via ORAL
  Filled 2019-08-05 (×13): qty 5

## 2019-08-05 MED ORDER — INSULIN ASPART 100 UNIT/ML ~~LOC~~ SOLN
4.0000 [IU] | Freq: Three times a day (TID) | SUBCUTANEOUS | Status: DC
  Administered 2019-08-05 – 2019-08-06 (×5): 4 [IU] via SUBCUTANEOUS
  Filled 2019-08-05 (×5): qty 1

## 2019-08-05 MED ORDER — STERILE WATER FOR INJECTION IV SOLN
INTRAVENOUS | Status: DC
  Filled 2019-08-05 (×6): qty 850

## 2019-08-05 MED ORDER — INSULIN GLARGINE 100 UNIT/ML ~~LOC~~ SOLN
32.0000 [IU] | Freq: Every day | SUBCUTANEOUS | Status: DC
  Administered 2019-08-05 – 2019-08-06 (×2): 32 [IU] via SUBCUTANEOUS
  Filled 2019-08-05 (×3): qty 0.32

## 2019-08-05 MED ORDER — ACETAMINOPHEN 325 MG PO TABS
650.0000 mg | ORAL_TABLET | Freq: Three times a day (TID) | ORAL | Status: DC | PRN
  Filled 2019-08-05: qty 2

## 2019-08-05 MED ORDER — ATORVASTATIN CALCIUM 20 MG PO TABS
80.0000 mg | ORAL_TABLET | Freq: Every day | ORAL | Status: DC
  Administered 2019-08-05 – 2019-08-11 (×6): 80 mg via ORAL
  Filled 2019-08-05 (×6): qty 4

## 2019-08-05 MED ORDER — ONDANSETRON HCL 4 MG PO TABS: 4.0000 mg | ORAL_TABLET | Freq: Four times a day (QID) | ORAL | Status: DC | PRN

## 2019-08-05 MED ORDER — SODIUM CHLORIDE 0.9 % IV SOLN: INTRAVENOUS | Status: DC

## 2019-08-05 MED ORDER — COLLAGENASE 250 UNIT/GM EX OINT
1.0000 "application " | TOPICAL_OINTMENT | Freq: Two times a day (BID) | CUTANEOUS | Status: DC
  Administered 2019-08-05 – 2019-08-11 (×10): 1 via TOPICAL
  Filled 2019-08-05 (×2): qty 30

## 2019-08-05 MED ORDER — INSULIN ASPART 100 UNIT/ML ~~LOC~~ SOLN
20.0000 [IU] | Freq: Once | SUBCUTANEOUS | Status: AC
  Administered 2019-08-05: 09:00:00 20 [IU] via SUBCUTANEOUS
  Filled 2019-08-05: qty 1

## 2019-08-05 MED ORDER — SODIUM BICARBONATE 650 MG PO TABS
650.0000 mg | ORAL_TABLET | Freq: Two times a day (BID) | ORAL | Status: DC
  Administered 2019-08-05 – 2019-08-11 (×12): 650 mg via ORAL
  Filled 2019-08-05 (×14): qty 1

## 2019-08-05 MED ORDER — INSULIN ASPART 100 UNIT/ML ~~LOC~~ SOLN
0.0000 [IU] | Freq: Three times a day (TID) | SUBCUTANEOUS | Status: DC
  Administered 2019-08-05: 13:00:00 11 [IU] via SUBCUTANEOUS
  Administered 2019-08-05: 18:00:00 3 [IU] via SUBCUTANEOUS
  Administered 2019-08-06: 4 [IU] via SUBCUTANEOUS
  Administered 2019-08-06: 3 [IU] via SUBCUTANEOUS
  Filled 2019-08-05 (×3): qty 1

## 2019-08-05 MED ORDER — GABAPENTIN 300 MG PO CAPS
300.0000 mg | ORAL_CAPSULE | Freq: Three times a day (TID) | ORAL | Status: DC
  Administered 2019-08-05 – 2019-08-11 (×17): 300 mg via ORAL
  Filled 2019-08-05 (×17): qty 1

## 2019-08-05 MED ORDER — PANTOPRAZOLE SODIUM 40 MG PO TBEC
40.0000 mg | DELAYED_RELEASE_TABLET | Freq: Every day | ORAL | Status: DC
  Administered 2019-08-05 – 2019-08-11 (×6): 40 mg via ORAL
  Filled 2019-08-05 (×6): qty 1

## 2019-08-05 MED ORDER — INSULIN ASPART 100 UNIT/ML ~~LOC~~ SOLN: 0.0000 [IU] | Freq: Every day | SUBCUTANEOUS | Status: DC

## 2019-08-05 MED ORDER — SODIUM CHLORIDE 0.45 % IV SOLN: INTRAVENOUS | Status: DC

## 2019-08-05 MED ORDER — AMLODIPINE BESYLATE 5 MG PO TABS
2.5000 mg | ORAL_TABLET | Freq: Every day | ORAL | Status: DC
  Administered 2019-08-05 – 2019-08-11 (×6): 2.5 mg via ORAL
  Filled 2019-08-05 (×6): qty 1

## 2019-08-05 MED ORDER — METOPROLOL SUCCINATE ER 25 MG PO TB24
25.0000 mg | ORAL_TABLET | Freq: Every day | ORAL | Status: DC
  Administered 2019-08-05 – 2019-08-10 (×5): 25 mg via ORAL
  Filled 2019-08-05 (×6): qty 1

## 2019-08-05 MED ORDER — SACCHAROMYCES BOULARDII 250 MG PO CAPS
250.0000 mg | ORAL_CAPSULE | Freq: Two times a day (BID) | ORAL | Status: DC
  Administered 2019-08-05 – 2019-08-11 (×12): 250 mg via ORAL
  Filled 2019-08-05 (×14): qty 1

## 2019-08-05 MED ORDER — ASCORBIC ACID 500 MG PO TABS
500.0000 mg | ORAL_TABLET | Freq: Two times a day (BID) | ORAL | Status: DC
  Administered 2019-08-05 – 2019-08-11 (×12): 500 mg via ORAL
  Filled 2019-08-05 (×12): qty 1

## 2019-08-05 NOTE — ED Notes (Signed)
Patients ointment applied to buttocks on wounds. Wound present right in center of buttocks and another wound present on right buttocks. Wounds foul smelling. Pressure dressing applied over top to help heal/protect wound

## 2019-08-05 NOTE — ED Notes (Signed)
Spoke with son Shanon Brow [phone]) last night and he stated that he has concerns about the way his mother is being cared for at her current facility and would like her to be moved to another facility.  A chat message has been sent to Jonelle Sidle MD (attending) and Randol Kern NP (floor coverage) to request an order for a social work referral.  Son mentioned two facilities that he would like considered:  [facility] in [place] and [facility] in [place].

## 2019-08-05 NOTE — ED Notes (Signed)
Message sent to Lorella Nimrod, MD to call son for update when possible per son and patient request

## 2019-08-05 NOTE — TOC Initial Note (Signed)
Transition of Care [facility]) - Initial/Assessment Note    Patient Details  Name: Anne George MRN: [id number] Date of Birth: [birth year]  [facility] CM/SW Contact:    Anselm Pancoast, RN Phone Number: 08/05/2019, 12:22 PM  Clinical Narrative:                 Spoke with son who states he does not want patient to return to [facility] due to dissatisfaction with care received. Son is requesting possible placement at [facility]  Once medically cleared. Patient will discharge home with son once completes short term SNF.   Expected Discharge Plan: [place]     Patient Goals and CMS Choice Patient states their goals for this hospitalization and ongoing recovery are:: Get stronger and discharge to a better SNF      Expected Discharge Plan and Services Expected Discharge Plan: [place]       Living arrangements for the past 2 months: [place]                                      Prior Living Arrangements/Services Living arrangements for the past 2 months: [place] Lives with:: Self Patient language and need for interpreter reviewed:: Yes Do you feel safe going back to the place where you live?: Yes      Need for Family Participation in Patient Care: Yes (Comment)     Criminal Activity/Legal Involvement Pertinent to Current Situation/Hospitalization: No - Comment as needed  Activities of Daily Living      Permission Sought/Granted Permission sought to share information with : Case Manager, Family Supports Permission granted to share information with : Yes, Verbal Permission Granted              Emotional Assessment Appearance:: Appears stated age Attitude/Demeanor/Rapport: Engaged Affect (typically observed): Calm Orientation: : Oriented to Self, Oriented to Place, Oriented to  Time, Oriented to Situation Alcohol / Substance Use: Never Used Psych Involvement: No  (comment)  Admission diagnosis:  Generalized weakness [R53.1] Patient Active Problem List   Diagnosis Date Noted  . Generalized weakness 08/04/2019  . Hyperosmolar non-ketotic state in patient with type 2 diabetes mellitus ([place]) 07/14/2019  . DVT, lower extremity, proximal, acute, left ([place]) 07/14/2019  . COVID-19 virus infection 07/14/2019  . Acute renal failure superimposed on stage 4 chronic kidney disease ([place]) 07/14/2019  . Essential hypertension 07/14/2019  . FTT (failure to thrive) in adult 07/14/2019  . Obesity (BMI 30.0-34.9) 07/14/2019  . Skin ulcer of sacrum, limited to breakdown of skin ([place])   . Mild nonproliferative diabetic retinopathy of both eyes without macular edema associated with type 1 diabetes mellitus ([place]) 07/03/2018  . Cellulitis of left lower extremity 03/05/2018  . Venous stasis 11/29/2017  . Hyperopia with astigmatism and presbyopia, bilateral 03/20/2017  . Chronic kidney disease (CKD) stage G4/A1, severely decreased glomerular filtration rate (GFR) between 15-29 mL/min/1.73 square meter and albuminuria creatinine ratio less than 30 mg/g (HCC) 12/28/2016  . Coronary artery disease 08/22/2013  . Back pain 12/12/2012  . Hyperkalemia 03/15/2012  . Hypercholesterolemia 08/24/2011  . History of rectal cancer 05/23/2011  . Ovarian failure 03/18/2010  . Symptomatic menopausal or female climacteric states 03/18/2010  . Acquired spondylolisthesis 03/01/2010  . Edema 03/18/2006  . Peptic ulcer 05/15/1995  . Osteoarthrosis 08/13/1992  . 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

## 2019-08-05 NOTE — Progress Notes (Signed)
Inpatient Diabetes Program Recommendations  AACE/ADA: New Consensus Statement on Inpatient Glycemic Control (2015)  Target Ranges:  Prepandial:   less than 140 mg/dL      [facility] postprandial:   less than 180 mg/dL (1-2 hours)      Critically ill patients:  140 - 180 mg/dL   Lab Results  Component Value Date   GLUCAP 300 (H) 08/05/2019   HGBA1C 8.4 (H) 07/14/2019    Review of Glycemic Control Results for Anne George, Anne George (MRN [id number]) as of 08/05/2019 14:32  Ref. Range 08/05/2019 08:37 08/05/2019 12:23  Glucose-Capillary Latest Ref Range: 70 - 99 mg/dL 418 (H) 300 (H)   Diabetes history: DM Type 1 per history Outpatient Diabetes medications:  Novolog 2 units for every 15 grams of CHO Lantus 32 units q HS Current orders for Inpatient glycemic control:  Novolog resistant tid with meals and HS Novolog 4 units tid with meals  Lantus 32 units q HS  Inpatient Diabetes Program Recommendations:    Note low CO2 this morning with BMP. May consider reducing Novolog correction to sensitive to to reduced creatinine clearance.    Will follow.   Thanks,  Adah Perl, RN, BC-ADM Inpatient Diabetes Coordinator Pager [phone] (8a-5p)

## 2019-08-05 NOTE — ED Notes (Signed)
Dinner tray sent up with patient.

## 2019-08-05 NOTE — Progress Notes (Signed)
OT Cancellation Note  Patient Details Name: AMEERAH INSANA MRN: [id number] DOB: February 16, 1946   Cancelled Treatment:    Reason Eval/Treat Not Completed: Medical issues which prohibited therapy   OT consult received and chart reviewed.  Upon chart review, noted that K+ at 2.8.  Will follow up for evaluation as patient becomes appropriate.    Oren Binet 08/05/2019, 3:57 PM

## 2019-08-05 NOTE — ED Notes (Signed)
Patient pulled up in bed and set up with breakfast tray

## 2019-08-05 NOTE — ED Notes (Signed)
Lunch tray offered; pt declines at this time.

## 2019-08-05 NOTE — ED Notes (Signed)
Lab notified to add on Magnesium level.

## 2019-08-05 NOTE — Plan of Care (Signed)

## 2019-08-05 NOTE — ED Notes (Signed)
MD Amin,Sumayya messaged about patients blood sugar of 418. MD placed new orders for coverage. See [facility]

## 2019-08-05 NOTE — Progress Notes (Addendum)
PROGRESS NOTE    Anne George  [id number] DOB: Oct 22, 1945 DOA: 08/04/2019 PCP: Joseph Pierini, MD   Brief Narrative:  Anne George is a 74 y.o. female with medical history significant of coronary artery disease, hypertension, hyperlipidemia, type 1 diabetes, recent DVT with vascular intervention, recent COVID-19 infection about a month ago with no active symptoms, who was recently admitted to the hospital and discharged about 2 weeks ago.  Came back today complaining of generalized weakness and noticing some blood clot suspected to be from vaginal.  She was seen and evaluated in the ER.  She mentioned the bleeding happens when she stands up. Will look infected with history of multiple recurrent UTIs.  Patient is also on Eliquis and aspirin.  Admitted for failure to thrive, generalized weakness, UTI and vaginal bleeding.  Subjective: Patient was complaining of blood coming out whenever she stands up and she was very worried.  Denies any melena or hematochezia.  She has a colostomy bag which was without any blood.  She was also complaining of being very weak.  She was very unhappy with her prior facility and does not want to go back there.  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  Most likely multifactorial with her comorbidities, recent Covid infection and UTI. She does not want to go back to her prior facility stating that they do not treat her well. -PT/OT evaluation. -Social worker to find her a new place.  UTI.  Urine looks infected with hematuria, positive for new triads, nitrites and many bacteria. -Urine culture results pending. -Continue ceftriaxone-we will de-escalate antibiotics once culture results become  available.  None anion gap metabolic acidosis.  Patient has bicarb of 11, markedly elevated CBG, no ketones in urine.  Patient did had leukocytosis which seems improving with ceftriaxone.  Remained afebrile. -Continue IV fluid. -Check bete hydroxybutyric acid-is in normal limit -Check lactic acid- 2.0-give some IV fluid and repeat lactic acid. -Repeat BMP-bicarb 12.  Partly can be due to worsening renal failure. -Start her on bicarb infusion.  Hypokalemia.  Repleat potassium. -Continue to monitor  Bilateral buttock wounds.  Nursing concern about buttock wounds.  Appears chronic.  She do have an history of MRSA growing in her wound culture. Currently patient is afebrile and leukocytosis started improving. -Wound care consult.   Suspected vaginal bleed.  Patient was very concerned about vaginal bleeding.  Hemoglobin remained stable.  Patient has an history of anal cancer and radiation to that area. -Consult GYN.  Colostomy.  Patient has colostomy bag with normal brown feces in it. -Colostomy care consult.  Insulin dependent diabetes.  A1c at 8.4 on 07/14/2019.  Since -CBG elevated. -Switch sliding scale with resistant. -Add 4 units for mealtime coverage. -Continue Lantus.  Acute on chronic kidney disease stage IV.  Creatinine improved to 2.79, appears close to her baseline. -Continue to monitor. -Avoid nephrotoxic -Continue gentle IV hydration.  Essential hypertension.  Blood pressure within goal -Continue home regimen.  Recent COVID-19 infection.  Patient is out of infectious window. -No concerning symptoms at this time.  CAD.  No chest pain. -Continue to monitor.  Objective: Vitals:   08/05/19 0900 08/05/19 0951 08/05/19 1100 08/05/19 1120  BP: 126/61 126/61 (!) 120/40   Pulse: 94  87 77  Resp: 14  18 18   Temp:      TempSrc:      SpO2: 100%  100% 100%  Weight:      Height:        Intake/Output Summary (Last 24 hours) at 08/05/2019 1407 Last data filed at  08/05/2019 0256 Gross per 24 hour  Intake 600 ml  Output --  Net 600 ml   Filed Weights   08/04/19 1600  Weight: 72.6 kg    Examination:  General exam: Chronically ill-appearing elderly lady, appears calm and comfortable  Respiratory system: Clear to auscultation. Respiratory effort normal. Cardiovascular system: S1 & S2 heard, RRR. No JVD, murmurs, rubs, gallops or clicks. Gastrointestinal system: Soft, nontender, nondistended, bowel sounds positive, intact colostomy bag with brown feces. Central nervous system: Alert and oriented. No focal neurological deficits. Extremities: No edema, no cyanosis, pulses intact and symmetrical.  Signs of chronic venous stasis dermatitis. Psychiatry: Judgement and insight appear normal.   DVT prophylaxis: Eliquis Code Status: Full Family Communication: Son was updated on phone. Disposition Plan: Pending improvement.  She will need a new facility, pending bed availability.  Consultants:   GYN  Procedures:  Antimicrobials:  Ceftriaxone  Data Reviewed: I have personally reviewed following labs and imaging studies  CBC: Recent Labs  Lab 08/04/19 1613 08/05/19 0504  WBC 19.9* 18.0*  NEUTROABS 13.2*  --   HGB 11.2* 9.7*  HCT 35.8* 31.6*  MCV 90.4 91.3  PLT 478* 123456*   Basic Metabolic Panel: Recent Labs  Lab 08/04/19 1613 08/05/19 0504  NA 130* 134*  K 4.1 3.9  CL 106 109  CO2 12* 11*  GLUCOSE 398* 435*  BUN 89* 93*  CREATININE 3.10* 2.75*  CALCIUM 9.1 8.9   GFR: Estimated Creatinine Clearance: 15.6 mL/min (A) (by C-G formula based on SCr of 2.75 mg/dL (H)). Liver Function Tests: Recent Labs  Lab 08/04/19 1613 08/05/19 0504  AST 93* 47*  ALT 90* 67*  ALKPHOS 183* 159*  BILITOT 0.6 0.5  PROT 7.7 7.1  ALBUMIN 2.2* 2.0*   No results for input(s): LIPASE, AMYLASE in the last 168 hours. No results for input(s): AMMONIA in the last 168 hours. Coagulation Profile: Recent Labs  Lab 08/04/19 1613  INR 1.7*   Cardiac  Enzymes: No results for input(s): CKTOTAL, CKMB, CKMBINDEX, TROPONINI in the last 168 hours. BNP (last 3 results) No results for input(s): PROBNP in the last 8760 hours. HbA1C: No results for input(s): HGBA1C in the last 72 hours. CBG: Recent Labs  Lab 08/05/19 0837 08/05/19 1223  GLUCAP 418* 300*   Lipid Profile: No results for input(s): CHOL, HDL, LDLCALC, TRIG, CHOLHDL, LDLDIRECT in the last 72 hours. Thyroid Function Tests: No results for input(s): TSH, T4TOTAL, FREET4, T3FREE, THYROIDAB in the last 72 hours. Anemia Panel: No results for input(s): VITAMINB12, FOLATE, FERRITIN, TIBC, IRON, RETICCTPCT in the last 72 hours. Sepsis Labs: No results for input(s): PROCALCITON, LATICACIDVEN in the last 168 hours.  No results found for this or any previous visit (from the past 240 hour(s)).   Radiology Studies: DG Chest Portable 1 View  Result Date: 08/04/2019 CLINICAL DATA:  Weakness EXAM: PORTABLE CHEST 1 VIEW COMPARISON:  07/14/2019 FINDINGS: The heart size and mediastinal contours are within normal limits. Aortic atherosclerosis. Both lungs are clear. The visualized skeletal structures are unremarkable. IMPRESSION: No active disease. Electronically Signed   By: Donavan Foil M.D.   On: 08/04/2019 17:14    Scheduled Meds: . amLODipine  2.5  mg Oral Daily  . apixaban  2.5 mg Oral BID  . ascorbic acid  500 mg Oral BID  . aspirin EC  81 mg Oral Daily  . atorvastatin  80 mg Oral Daily  . cholecalciferol  5,000 Units Oral Daily  . collagenase  1 application Topical BID  . feeding supplement (PRO-STAT SUGAR FREE 64)  30 mL Oral TID WC  . gabapentin  300 mg Oral TID  . insulin aspart  0-20 Units Subcutaneous TID WC  . insulin aspart  0-5 Units Subcutaneous QHS  . insulin aspart  4 Units Subcutaneous TID WC  . insulin glargine  32 Units Subcutaneous QHS  . metoprolol succinate  25 mg Oral Daily  . multivitamin with minerals  1 tablet Oral Daily  . pantoprazole  40 mg Oral Daily   . saccharomyces boulardii  250 mg Oral BID  . sodium bicarbonate  650 mg Oral BID  . venlafaxine XR  75 mg Oral Daily   Continuous Infusions: . sodium chloride 75 mL/hr at 08/05/19 0405  . cefTRIAXone (ROCEPHIN)  IV       LOS: 1 day   Time spent: 40 minutes.  Lorella Nimrod, MD [facility]  If 7PM-7AM, please contact night-coverage [email or web address]  08/05/2019, 2:07 PM   This record has been created using Systems analyst. Errors have been sought and corrected,but may not always be located. Such creation errors do not reflect on the standard of care.

## 2019-08-06 ENCOUNTER — Inpatient Hospital Stay: Payer: Medicare PPO

## 2019-08-06 DIAGNOSIS — N939 Abnormal uterine and vaginal bleeding, unspecified: Secondary | ICD-10-CM

## 2019-08-06 DIAGNOSIS — E1122 Type 2 diabetes mellitus with diabetic chronic kidney disease: Secondary | ICD-10-CM

## 2019-08-06 DIAGNOSIS — N184 Chronic kidney disease, stage 4 (severe): Secondary | ICD-10-CM

## 2019-08-06 DIAGNOSIS — I82409 Acute embolism and thrombosis of unspecified deep veins of unspecified lower extremity: Secondary | ICD-10-CM

## 2019-08-06 DIAGNOSIS — L89153 Pressure ulcer of sacral region, stage 3: Secondary | ICD-10-CM

## 2019-08-06 DIAGNOSIS — L89620 Pressure ulcer of left heel, unstageable: Secondary | ICD-10-CM

## 2019-08-06 DIAGNOSIS — I82402 Acute embolism and thrombosis of unspecified deep veins of left lower extremity: Secondary | ICD-10-CM

## 2019-08-06 DIAGNOSIS — N3001 Acute cystitis with hematuria: Secondary | ICD-10-CM

## 2019-08-06 DIAGNOSIS — L97419 Non-pressure chronic ulcer of right heel and midfoot with unspecified severity: Secondary | ICD-10-CM

## 2019-08-06 LAB — CBC
HCT: 34.2 % — ABNORMAL LOW (ref 36.0–46.0)
Hemoglobin: 10.5 g/dL — ABNORMAL LOW (ref 12.0–15.0)
MCH: 28 pg (ref 26.0–34.0)
MCHC: 30.7 g/dL (ref 30.0–36.0)
MCV: 91.2 fL (ref 80.0–100.0)
Platelets: 525 10*3/uL — ABNORMAL HIGH (ref 150–400)
RBC: 3.75 MIL/uL — ABNORMAL LOW (ref 3.87–5.11)
RDW: 15.7 % — ABNORMAL HIGH (ref 11.5–15.5)
WBC: 18.1 10*3/uL — ABNORMAL HIGH (ref 4.0–10.5)
nRBC: 0 % (ref 0.0–0.2)

## 2019-08-06 LAB — RENAL FUNCTION PANEL
Albumin: 2 g/dL — ABNORMAL LOW (ref 3.5–5.0)
Anion gap: 11 (ref 5–15)
BUN: 77 mg/dL — ABNORMAL HIGH (ref 8–23)
CO2: 15 mmol/L — ABNORMAL LOW (ref 22–32)
Calcium: 9 mg/dL (ref 8.9–10.3)
Chloride: 111 mmol/L (ref 98–111)
Creatinine, Ser: 2.66 mg/dL — ABNORMAL HIGH (ref 0.44–1.00)
GFR calc Af Amer: 20 mL/min — ABNORMAL LOW (ref >60)
GFR calc non Af Amer: 17 mL/min — ABNORMAL LOW (ref >60)
Glucose, Bld: 166 mg/dL — ABNORMAL HIGH (ref 70–99)
Phosphorus: 2.5 mg/dL (ref 2.5–4.6)
Potassium: 3.8 mmol/L (ref 3.5–5.1)
Sodium: 137 mmol/L (ref 135–145)

## 2019-08-06 LAB — GLUCOSE, CAPILLARY
Glucose-Capillary: 129 mg/dL — ABNORMAL HIGH (ref 70–99)
Glucose-Capillary: 151 mg/dL — ABNORMAL HIGH (ref 70–99)
Glucose-Capillary: 98 mg/dL (ref 70–99)
Glucose-Capillary: 121 mg/dL — ABNORMAL HIGH (ref 70–99)

## 2019-08-06 LAB — C DIFFICILE QUICK SCREEN W PCR REFLEX
C Diff antigen: NEGATIVE
C Diff interpretation: NOT DETECTED
C Diff toxin: NEGATIVE

## 2019-08-06 MED ORDER — INSULIN ASPART 100 UNIT/ML ~~LOC~~ SOLN
0.0000 [IU] | Freq: Three times a day (TID) | SUBCUTANEOUS | Status: DC
  Administered 2019-08-08: 10:00:00 2 [IU] via SUBCUTANEOUS
  Administered 2019-08-08: 13:00:00 4 [IU] via SUBCUTANEOUS
  Administered 2019-08-08 – 2019-08-09 (×2): 5 [IU] via SUBCUTANEOUS
  Administered 2019-08-09: 13:00:00 6 [IU] via SUBCUTANEOUS
  Administered 2019-08-09 – 2019-08-10 (×2): 3 [IU] via SUBCUTANEOUS
  Administered 2019-08-10: 09:00:00 1 [IU] via SUBCUTANEOUS
  Administered 2019-08-10 – 2019-08-11 (×2): 2 [IU] via SUBCUTANEOUS
  Filled 2019-08-06 (×10): qty 1

## 2019-08-06 MED ORDER — INSULIN ASPART 100 UNIT/ML ~~LOC~~ SOLN
0.0000 [IU] | Freq: Every day | SUBCUTANEOUS | Status: DC
  Administered 2019-08-07: 3 [IU] via SUBCUTANEOUS
  Administered 2019-08-09: 5 [IU] via SUBCUTANEOUS
  Administered 2019-08-10: 2 [IU] via SUBCUTANEOUS
  Filled 2019-08-06 (×4): qty 1

## 2019-08-06 MED ORDER — SODIUM CHLORIDE 0.9 % IV SOLN: INTRAVENOUS | Status: DC

## 2019-08-06 NOTE — Discharge Instructions (Signed)
Podiatry discharge instructions: Apply Betadine paint to the posterior right heel wound.  Perform this daily. Wear Prevalon boots or use pillows underneath legs to maintain pressure off the posterior aspects of both heels at all times when resting.

## 2019-08-06 NOTE — Consult Note (Signed)
I have placed a request via Secure Chat to Dr. Wieting requesting photos of the wound areas of concern to be placed in the EMR.   Angelee Bahr MSN,RN,CWOCN, CNS, CWON-AP [phone] 

## 2019-08-06 NOTE — Progress Notes (Signed)
Patient ID: Anne George, female   DOB: [birth year], 74 y.o.   MRN: [id number] [facility] PROGRESS NOTE  YUBIA SCALF [id number] DOB: [birth year] DOA: 08/04/2019 PCP: Joseph Pierini, MD  HPI/Subjective: Patient feeling okay.  Nurses noted some leaking from the ostomy and she was waiting for new supplies.  Did have some diarrhea in the ostomy.  Patient states the bleeding is coming from her vagina.  Very rarely has any output from her rectum.  Objective: Vitals:   08/05/19 1750 08/06/19 0803  BP: (!) 123/48 (!) 121/49  Pulse: 77 88  Resp: 17 17  Temp: 97.6 F (36.4 C) (!) 97.5 F (36.4 C)  SpO2: 100% 100%    Intake/Output Summary (Last 24 hours) at 08/06/2019 1406 Last data filed at 08/06/2019 0500 Gross per 24 hour  Intake 1515.8 ml  Output 600 ml  Net 915.8 ml   Filed Weights   08/04/19 1600  Weight: 72.6 kg    ROS: Review of Systems  Constitutional: Negative for chills and fever.  Eyes: Negative for blurred vision.  Respiratory: Negative for cough and shortness of breath.   Cardiovascular: Negative for chest pain.  Gastrointestinal: Negative for abdominal pain, constipation, diarrhea, nausea and vomiting.  Genitourinary: Negative for dysuria.  Musculoskeletal: Positive for joint pain.  Neurological: Negative for dizziness and headaches.   Exam: Physical Exam  Constitutional: She is oriented to person, place, and time.  HENT:  Nose: No mucosal edema.  Mouth/Throat: No oropharyngeal exudate or posterior oropharyngeal edema.  Eyes: Pupils are equal, round, and reactive to light. Conjunctivae, EOM and lids are normal.  Neck: Carotid bruit is not present.  Cardiovascular: S1 normal and S2 normal. Exam reveals no gallop.  No murmur heard. Respiratory: No respiratory distress. She has no wheezes. She has no rhonchi. She has no rales.  GI: Soft. Bowel sounds are normal. There is no abdominal tenderness.  Musculoskeletal:     Right ankle: Swelling  present.     Left ankle: Swelling present.  Lymphadenopathy:    She has no cervical adenopathy.  Neurological: She is alert and oriented to person, place, and time. No cranial nerve deficit.  Skin: Skin is warm. Nails show no clubbing.  Chronic lower extremity discoloration. Stage III decubitus sacrum Right heel decubiti with necrotic material on it Left thigh healing blister  Psychiatric: She has a normal mood and affect.            Data Reviewed: Basic Metabolic Panel: Recent Labs  Lab 08/04/19 1613 08/05/19 0504 08/05/19 1449 08/06/19 0401  NA 130* 134* 133* 137  K 4.1 3.9 2.8* 3.8  CL 106 109 109 111  CO2 12* 11* 12* 15*  GLUCOSE 398* 435* 192* 166*  BUN 89* 93* 87* 77*  CREATININE 3.10* 2.75* 2.78* 2.66*  CALCIUM 9.1 8.9 8.8* 9.0  MG  --   --  1.2*  --   PHOS  --   --   --  2.5   Liver Function Tests: Recent Labs  Lab 08/04/19 1613 08/05/19 0504 08/06/19 0401  AST 93* 47*  --   ALT 90* 67*  --   ALKPHOS 183* 159*  --   BILITOT 0.6 0.5  --   PROT 7.7 7.1  --   ALBUMIN 2.2* 2.0* 2.0*   CBC: Recent Labs  Lab 08/04/19 1613 08/05/19 0504 08/06/19 0401  WBC 19.9* 18.0* 18.1*  NEUTROABS 13.2*  --   --   HGB 11.2* 9.7* 10.5*  HCT 35.8*  31.6* 34.2*  MCV 90.4 91.3 91.2  PLT 478* 439* 525*    CBG: Recent Labs  Lab 08/05/19 1223 08/05/19 1603 08/05/19 2140 08/06/19 0807 08/06/19 1157  GLUCAP 300* 125* 169* 129* 151*    Recent Results (from the past 240 hour(s))  Urine culture     Status: Abnormal (Preliminary result)   Collection Time: 08/04/19  9:34 PM   Specimen: Urine, Random  Result Value Ref Range Status   Specimen Description URINE, RANDOM  Final   Special Requests   Final    NONE Performed at [facility], [address] [place], [place] [address]    Culture (A)  Final    >=100,000 COLONIES/mL MORGANELLA MORGANII SUSCEPTIBILITIES TO FOLLOW    Report Status PENDING  Incomplete  C difficile quick scan w PCR reflex      Status: None   Collection Time: 08/06/19 10:10 AM   Specimen: STOOL  Result Value Ref Range Status   C Diff antigen NEGATIVE NEGATIVE Final   C Diff toxin NEGATIVE NEGATIVE Final   C Diff interpretation No C. difficile detected.  Final    Comment: Performed at [facility], [address] [place], [place] [address]     Studies: DG Chest Portable 1 View  Result Date: 08/04/2019 CLINICAL DATA:  Weakness EXAM: PORTABLE CHEST 1 VIEW COMPARISON:  07/14/2019 FINDINGS: The heart size and mediastinal contours are within normal limits. Aortic atherosclerosis. Both lungs are clear. The visualized skeletal structures are unremarkable. IMPRESSION: No active disease. Electronically Signed   By: Donavan Foil M.D.   On: 08/04/2019 17:14    Scheduled Meds: . amLODipine  2.5 mg Oral Daily  . ascorbic acid  500 mg Oral BID  . atorvastatin  80 mg Oral Daily  . cholecalciferol  5,000 Units Oral Daily  . collagenase  1 application Topical BID  . feeding supplement (PRO-STAT SUGAR FREE 64)  30 mL Oral TID WC  . gabapentin  300 mg Oral TID  . insulin aspart  0-5 Units Subcutaneous QHS  . insulin aspart  0-6 Units Subcutaneous TID WC  . insulin aspart  4 Units Subcutaneous TID WC  . insulin glargine  32 Units Subcutaneous QHS  . metoprolol succinate  25 mg Oral Daily  . multivitamin with minerals  1 tablet Oral Daily  . pantoprazole  40 mg Oral Daily  . saccharomyces boulardii  250 mg Oral BID  . sodium bicarbonate  650 mg Oral BID  . venlafaxine XR  75 mg Oral Daily   Continuous Infusions: . cefTRIAXone (ROCEPHIN)  IV Stopped (08/05/19 2302)  .  sodium bicarbonate (isotonic) infusion in sterile water 100 mL/hr at 08/06/19 1019    Assessment/Plan:  1. Vaginal bleeding.  Son states this happened in the past but they never found a source for this.  Hold blood thinners for right now.  Await gynecological opinion.  Added on a CEA with her history of colon cancer. 2. Left lower extremity  DVT status post procedure by vascular surgery.  Holding blood thinners right now.  Vascular consult.  May end up needing a filter if unable to tolerate blood thinners. 3. Stage III sacral decubiti, right heel decubiti with necrotic material, left thigh blister.  All present on admission.  Please see pictures.  Wound care consultation.  Podiatry consultation for heel ulcer. 4. Acute cystitis with hematuria.  Leukocytosis.  Morganella growing out a urine culture on Rocephin. 5. Essential hypertension on metoprolol and Norvasc 6. Type 2  diabetes mellitus with chronic kidney disease stage IV on glargine insulin sliding scale. 7. Recent Covid infection 8. Diarrhea.  Stool negative for C. difficile  Code Status:     Code Status Orders  (From admission, onward)         Start     Ordered   08/05/19 0254  Full code  Continuous     08/05/19 0254        Code Status History    Date Active Date Inactive Code Status Order ID Comments User Context   07/14/2019 1735 07/22/2019 2203 Full Code [id number]  Louellen Molder, MD ED   Advance Care Planning Activity     Family Communication: Spoke with son on the phone Disposition Plan: Likely will end up needing rehab.  Will have to determine on whether the patient can tolerate blood thinner or not and patient will have to stop bleeding prior to disposition.  Consultants:  Gynecology  Vascular surgery  Podiatry  Antibiotics:  Rocephin  Time spent: 28 minutes  Karisma Meiser [employer]

## 2019-08-06 NOTE — Consult Note (Signed)
[facility] Vascular Consult Note  MRN : [id number]  Anne George is a 74 y.o. (May 03, 1946) female who presents with chief complaint of  Chief Complaint  Patient presents with  . Vaginal Bleeding   History of Present Illness:  The patient is a 74 year old female with multiple medical issues including recent diagnosis of left lower extremity extensive DVT (07/14/19) s/p (07/17/19)  1. US guidance for vascular access to left popliteal vein 2. Catheter placement into left common iliac vein from left popliteal approach 3. IVC gram and left lower extremity venogram 4.   Catheter directed thrombolysis with 6 mg of TPA to the left popliteal and femoral vein  5. Mechanical thrombectomy to the left popliteal, superficial femoral, common femoral, and iliac veins with the penumbra cat 12 device 6. PTA of left iliac veins with 12 mm diameter balloon 7. Stent placement to the left external iliac vein with 16 mm diameter by 6 cm length stent  The patient was placed on Eliquis 5 mg PO twice daily. Patient was last seen in the outpatient setting on July 30, 2019.  She underwent an ultrasound which was notable for resolution of the left lower extremity DVT.  Patient was encouraged to complete the 73-month course of p.o. anticoagulation.  Patient was tolerating Eliquis without issue until recent vaginal bleeding.  Patient was admitted and anticoagulation stopped.  Vascular surgery was consulted by Dr. Leslye Peer for possible IVC filter placement.  Current Facility-Administered Medications  Medication Dose Route Frequency Provider Last Rate Last Admin  . acetaminophen (TYLENOL) tablet 650 mg  650 mg Oral Q8H PRN Gala Romney L, MD      . amLODipine (NORVASC) tablet 2.5 mg  2.5 mg Oral Daily Gala Romney L, MD   2.5 mg at 08/06/19 0917  . ascorbic acid (VITAMIN C) tablet 500 mg  500 mg Oral BID Elwyn Reach, MD   500 mg at 08/06/19 0918  . atorvastatin  (LIPITOR) tablet 80 mg  80 mg Oral Daily Elwyn Reach, MD   80 mg at 08/06/19 0917  . cefTRIAXone (ROCEPHIN) 1 g in sodium chloride 0.9 % 100 mL IVPB  1 g Intravenous Q24H Elwyn Reach, MD   Stopped at 08/05/19 2302  . cholecalciferol (VITAMIN D) tablet 5,000 Units  5,000 Units Oral Daily Elwyn Reach, MD   5,000 Units at 08/06/19 0916  . collagenase (SANTYL) ointment 1 application  1 application Topical BID Elwyn Reach, MD   1 application at 0000000 0920  . feeding supplement (PRO-STAT SUGAR FREE 64) liquid 30 mL  30 mL Oral TID WC Gala Romney L, MD   30 mL at 08/06/19 1218  . gabapentin (NEURONTIN) capsule 300 mg  300 mg Oral TID Elwyn Reach, MD   300 mg at 08/06/19 0921  . insulin aspart (novoLOG) injection 0-5 Units  0-5 Units Subcutaneous QHS Wieting, Richard, MD      . insulin aspart (novoLOG) injection 0-6 Units  0-6 Units Subcutaneous TID WC Wieting, Richard, MD      . insulin aspart (novoLOG) injection 4 Units  4 Units Subcutaneous TID WC Lorella Nimrod, MD   4 Units at 08/06/19 1218  . insulin glargine (LANTUS) injection 32 Units  32 Units Subcutaneous QHS Elwyn Reach, MD   32 Units at 08/05/19 2228  . methocarbamol (ROBAXIN) tablet 500 mg  500 mg Oral Q8H PRN Gala Romney L, MD      . metoprolol succinate (TOPROL-XL) 24  hr tablet 25 mg  25 mg Oral Daily Elwyn Reach, MD   25 mg at 08/06/19 [id number]  . multivitamin with minerals tablet 1 tablet  1 tablet Oral Daily Elwyn Reach, MD   1 tablet at 08/06/19 0917  . ondansetron (ZOFRAN) tablet 4 mg  4 mg Oral Q6H PRN Elwyn Reach, MD       Or  . ondansetron (ZOFRAN) injection 4 mg  4 mg Intravenous Q6H PRN Gala Romney L, MD      . pantoprazole (PROTONIX) EC tablet 40 mg  40 mg Oral Daily Elwyn Reach, MD   40 mg at 08/06/19 0918  . saccharomyces boulardii (FLORASTOR) capsule 250 mg  250 mg Oral BID Gala Romney L, MD   250 mg at 08/06/19 0918  . sodium bicarbonate 150 mEq in  sterile water 1,000 mL infusion   Intravenous Continuous Lorella Nimrod, MD 100 mL/hr at 08/06/19 1019 New Bag at 08/06/19 1019  . sodium bicarbonate tablet 650 mg  650 mg Oral BID Elwyn Reach, MD   650 mg at 08/06/19 0918  . venlafaxine XR (EFFEXOR-XR) 24 hr capsule 75 mg  75 mg Oral Daily Elwyn Reach, MD   75 mg at 08/06/19 [id number]   Past Medical History:  Diagnosis Date  . Diabetes mellitus without complication [facility])    Past Surgical History:  Procedure Laterality Date  . PERIPHERAL VASCULAR THROMBECTOMY Left 07/17/2019   Procedure: PERIPHERAL VASCULAR THROMBECTOMY / THROMBOLYSIS;  Surgeon: Algernon Huxley, MD;  Location: [facility];  Service: Cardiovascular;  Laterality: Left;   Social History Social History   Tobacco Use  . Smoking status: Never Smoker  . Smokeless tobacco: Never Used  Substance Use Topics  . Alcohol use: Never  . Drug use: Never   Family History History reviewed. No pertinent family history.  Denies family history of peripheral artery disease, renal disease or renal disease.  No Known Allergies  REVIEW OF SYSTEMS (Negative unless checked)  Constitutional: [] Weight loss  [] Fever  [] Chills Cardiac: [] Chest pain   [] Chest pressure   [] Palpitations   [] Shortness of breath when laying flat   [] Shortness of breath at rest   [] Shortness of breath with exertion. Vascular:  [] Pain in legs with walking   [] Pain in legs at rest   [] Pain in legs when laying flat   [] Claudication   [] Pain in feet when walking  [] Pain in feet at rest  [] Pain in feet when laying flat   [x] History of DVT   [] Phlebitis   [x] Swelling in legs   [] Varicose veins   [] Non-healing ulcers Pulmonary:   [] Uses home oxygen   [] Productive cough   [] Hemoptysis   [] Wheeze  [] COPD   [] Asthma Neurologic:  [] Dizziness  [] Blackouts   [] Seizures   [] History of stroke   [] History of TIA  [] Aphasia   [] Temporary blindness   [] Dysphagia   [] Weakness or numbness in arms   [] Weakness or numbness in  legs Musculoskeletal:  [] Arthritis   [] Joint swelling   [] Joint pain   [] Low back pain Hematologic:  [] Easy bruising  [] Easy bleeding   [] Hypercoagulable state   [] Anemic  [] Hepatitis Gastrointestinal:  [] Blood in stool   [] Vomiting blood  [] Gastroesophageal reflux/heartburn   [] Difficulty swallowing. Genitourinary:  [] Chronic kidney disease   [] Difficult urination  [] Frequent urination  [] Burning with urination   [] Blood in urine Skin:  [] Rashes   [] Ulcers   [] Wounds Psychological:  [] History of anxiety   []  History  of major depression.  Physical Examination  Vitals:   08/05/19 1120 08/05/19 1600 08/05/19 1750 08/06/19 0803  BP:  (!) 112/43 (!) 123/48 (!) 121/49  Pulse: 77 77 77 88  Resp: 18 18 17 17   Temp:   97.6 F (36.4 C) (!) 97.5 F (36.4 C)  TempSrc:   Oral Oral  SpO2: 100% 100% 100% 100%  Weight:      Height:       Body mass index is 33.44 kg/m. Gen:  WD/WN, NAD Head: [place]/AT, No temporalis wasting. Prominent temp pulse not noted. Ear/Nose/Throat: Hearing grossly intact, nares w/o erythema or drainage, oropharynx w/o Erythema/Exudate Eyes: Sclera non-icteric, conjunctiva clear Neck: Trachea midline.  No JVD.  Pulmonary:  Good air movement, respirations not labored, equal bilaterally.  Cardiac: RRR, normal S1, S2. Vascular:  Vessel Right Left  Radial Palpable Palpable  Ulnar Palpable Palpable  Brachial Palpable Palpable  Carotid Palpable, without bruit Palpable, without bruit  Aorta Not palpable N/A  Femoral Palpable Palpable  Popliteal Palpable Palpable  PT Palpable Palpable  DP Palpable Palpable   Left Lower Extremity: Thigh soft.  Calf soft.  Extremities warm distally toes.  Nontender to palpation.  No erythema.  Mild edema.  Gastrointestinal: soft, non-tender/non-distended. No guarding/reflex.  Musculoskeletal: M/S 5/5 throughout.  Extremities without ischemic changes.  No deformity or atrophy. No edema. Neurologic: Sensation grossly intact in extremities.   Symmetrical.  Speech is fluent. Motor exam as listed above. Psychiatric: Judgment intact, Mood & affect appropriate for pt's clinical situation. Dermatologic: Stable heel eschar Lymph : No Cervical, Axillary, or Inguinal lymphadenopathy.  CBC Lab Results  Component Value Date   WBC 18.1 (H) 08/06/2019   HGB 10.5 (L) 08/06/2019   HCT 34.2 (L) 08/06/2019   MCV 91.2 08/06/2019   PLT 525 (H) 08/06/2019   BMET    Component Value Date/Time   NA 137 08/06/2019 0401   K 3.8 08/06/2019 0401   CL 111 08/06/2019 0401   CO2 15 (L) 08/06/2019 0401   GLUCOSE 166 (H) 08/06/2019 0401   BUN 77 (H) 08/06/2019 0401   CREATININE 2.66 (H) 08/06/2019 0401   CALCIUM 9.0 08/06/2019 0401   GFRNONAA 17 (L) 08/06/2019 0401   GFRAA 20 (L) 08/06/2019 0401   Estimated Creatinine Clearance: 16.2 mL/min (A) (by C-G formula based on SCr of 2.66 mg/dL (H)).  COAG Lab Results  Component Value Date   INR 1.7 (H) 08/04/2019   INR 1.4 (H) 07/14/2019   Radiology PERIPHERAL VASCULAR CATHETERIZATION  Result Date: 07/17/2019 See op note  US Venous Img Lower Bilateral (DVT)  Result Date: 07/14/2019 CLINICAL DATA:  74 year old female with bilateral LOWER extremity pain for 3 months. EXAM: BILATERAL LOWER EXTREMITY VENOUS DOPPLER ULTRASOUND TECHNIQUE: Gray-scale sonography with compression, as well as color and duplex ultrasound, were performed to evaluate the deep venous system(s) from the level of the common femoral vein through the popliteal and proximal calf veins. COMPARISON:  None. FINDINGS: RIGHT: Normal compressibility of the common femoral, superficial femoral, and popliteal veins, as well as the visualized calf veins. Visualized portions of profunda femoral vein and great saphenous vein unremarkable. No filling defects to suggest DVT on grayscale or color Doppler imaging. Doppler waveforms show normal direction of venous flow, normal respiratory phasicity and response to augmentation. LEFT: Occlusive DVT is  noted throughout the LEFT LOWER extremity including the common femoral, profundus femoral, femoral, popliteal and visualized calf veins. IMPRESSION: 1. Occlusive DVT throughout the visualized LEFT LOWER extremity. 2. No evidence of  RIGHT LOWER extremity DVT. Critical Value/emergent results were called by telephone at the time of interpretation on 07/14/2019 at 5:59 pm to [facility] , who verbally acknowledged these results. Electronically Signed   By: Margarette Canada M.D.   On: 07/14/2019 18:03   DG Chest Portable 1 View  Result Date: 08/04/2019 CLINICAL DATA:  Weakness EXAM: PORTABLE CHEST 1 VIEW COMPARISON:  07/14/2019 FINDINGS: The heart size and mediastinal contours are within normal limits. Aortic atherosclerosis. Both lungs are clear. The visualized skeletal structures are unremarkable. IMPRESSION: No active disease. Electronically Signed   By: Donavan Foil M.D.   On: 08/04/2019 17:14   DG Chest Portable 1 View  Result Date: 07/14/2019 CLINICAL DATA:  Recent COVID-19 positive.  Hyperglycemia. EXAM: PORTABLE CHEST 1 VIEW COMPARISON:  None. FINDINGS: Lungs are clear. Heart size and pulmonary vascularity are normal. No adenopathy. There is aortic atherosclerosis. No bone lesions. IMPRESSION: Lungs clear. Cardiac silhouette normal. No adenopathy. Aortic Atherosclerosis (ICD10-I70.0). Electronically Signed   By: Lowella Grip III M.D.   On: 07/14/2019 13:17   VAS [place] LOWER EXTREMITY VENOUS (DVT)  Result Date: 08/01/2019  Lower Venous DVTStudy Risk Factors: Surgery 07/17/2019: Mechanical Thrombectomy to the Left Popliteal vein, SFV, CFV and Iliac veins. PTA of the Left Iliac vein. Stent placemant to the Left External Iliac vein. Comparison Study: 07/14/2019 Performing Technologist: Almira Coaster RVS  Examination Guidelines: A complete evaluation includes B-mode imaging, spectral Doppler, color Doppler, and power Doppler as needed of all accessible portions of each vessel. Bilateral  testing is considered an integral part of a complete examination. Limited examinations for reoccurring indications may be performed as noted. The reflux portion of the exam is performed with the patient in reverse Trendelenburg.  +---------+---------------+---------+-----------+----------+--------------+ LEFT     CompressibilityPhasicitySpontaneityPropertiesThrombus Aging +---------+---------------+---------+-----------+----------+--------------+ CFV      Full           Yes      Yes                                 +---------+---------------+---------+-----------+----------+--------------+ SFJ      Full           Yes      Yes                                 +---------+---------------+---------+-----------+----------+--------------+ FV Prox  Full           Yes      Yes                                 +---------+---------------+---------+-----------+----------+--------------+ FV Mid   Full           Yes      Yes                                 +---------+---------------+---------+-----------+----------+--------------+ FV DistalFull           Yes      Yes                                 +---------+---------------+---------+-----------+----------+--------------+ PFV      Full           Yes  Yes                                 +---------+---------------+---------+-----------+----------+--------------+ POP      Full           Yes      Yes                                 +---------+---------------+---------+-----------+----------+--------------+ PTV      Full           Yes      Yes                                 +---------+---------------+---------+-----------+----------+--------------+ PERO     Full           Yes      Yes                                 +---------+---------------+---------+-----------+----------+--------------+ GSV      Full           Yes      Yes                                  +---------+---------------+---------+-----------+----------+--------------+     Summary: LEFT: - Findings appear improved from previous examination. - There is no evidence of deep vein thrombosis in the lower extremity. - There is no evidence of superficial venous thrombosis.  *See table(s) above for measurements and observations. Electronically signed by Leotis Pain MD on 08/01/2019 at 8:31:48 AM.    Final    Assessment/Plan The patient is a 74 year old female with multiple medical issues including recent diagnosis of left lower extremity extensive DVT (07/14/19) s/p endovascular venous thrombectomy / lysis (07/17/19)  1. Left Lower Extremity DVT: Patient with history of extensive left lower extremity history of DVT s/p endovascular intervention.  Patient was last seen in the outpatient setting on July 30, 2019.  She underwent an ultrasound which was notable for resolution of the DVT.  Recommend at least 6 months of oral anticoagulation to avoid reoccurance however in setting of vaginal bleeding Eliquis has been stopped. Awaiting gynecological evaluation.  If patient does need to discontinue oral anticoagulation recommend placing an IVC filter.  Procedure, risks and benefits explained to the patient.  All questions answered.  Patient wishes to proceed.  2.  Vaginal bleeding: Eliquis has been discontinued.  Awaiting gynecological evaluation.  3. DM1: Appropriate medications. Encouraged good control as its slows the progression of atherosclerotic / renal disease  Discussed with Dr. Mayme Genta, PA-C  08/06/2019 3:16 PM  This note was created with Dragon medical transcription system.  Any error is purely unintentional

## 2019-08-06 NOTE — Consult Note (Addendum)
PODIATRY / FOOT AND ANKLE SURGERY CONSULTATION NOTE  Requesting Physician: Loletha Grayer, MD  Reason for consult: Right heel ulceration  Chief Complaint: Right heel sore   HPI: Anne George is a 74 y.o. female who presents with a right heel posterior eschar/ulceration.  Patient was admitted to [facility] due to UTI and maintenance for other medical issues.  Patient also had been shown to have some vaginal bleeding.  An IVC filter is to be placed by vascular due to previous history of DVT status post endovascular intervention and current vaginal bleeding concerning for potential anemia.  Podiatry team was consulted for further evaluation of the right posterior/plantar heel ulceration.  Patient denies nausea, vomiting, fever, chills today.  PMHx:  Past Medical History:  Diagnosis Date  . Diabetes mellitus without complication [facility])     Surgical Hx:  Past Surgical History:  Procedure Laterality Date  . PERIPHERAL VASCULAR THROMBECTOMY Left 07/17/2019   Procedure: PERIPHERAL VASCULAR THROMBECTOMY / THROMBOLYSIS;  Surgeon: Algernon Huxley, MD;  Location: [facility];  Service: Cardiovascular;  Laterality: Left;    FHx: History reviewed. No pertinent family history.  Social History:  reports that she has never smoked. She has never used smokeless tobacco. She reports that she does not drink alcohol or use drugs.  Allergies: No Known Allergies  Review of Systems: General ROS: positive for  - fatigue Respiratory ROS: no cough, shortness of breath, or wheezing Cardiovascular ROS: no chest pain or dyspnea on exertion Gastrointestinal ROS: no abdominal pain, change in bowel habits, or black or bloody stools Genito-Urinary ROS: no dysuria, trouble voiding, or hematuria Musculoskeletal ROS: positive for - swelling in leg - bilateral Neurological ROS: positive for - numbness/tingling Dermatological ROS: positive for Multiple pressure ulcerations more  specifically at the plantar/posterior aspect of the right heel.  Medications Prior to Admission  Medication Sig Dispense Refill  . acetaminophen (TYLENOL) 650 MG CR tablet Take 650 mg by mouth every 8 (eight) hours as needed for pain.    . Amino Acids-Protein Hydrolys (FEEDING SUPPLEMENT, PRO-STAT SUGAR FREE 64,) LIQD Take 30 mLs by mouth 3 (three) times daily with meals.    Marland Kitchen amLODipine (NORVASC) 2.5 MG tablet Take 2.5 mg by mouth daily.    Marland Kitchen apixaban (ELIQUIS) 5 MG TABS tablet Take 2 tablets (10mg ) twice daily for 7 days, then 1 tablet (5mg ) twice daily 60 tablet 0  . ascorbic acid (VITAMIN C) 500 MG tablet Take 500 mg by mouth 2 (two) times daily.    Marland Kitchen aspirin 81 MG EC tablet Take 81 mg by mouth daily.    Marland Kitchen atorvastatin (LIPITOR) 80 MG tablet Take 80 mg by mouth daily.    . Cholecalciferol 125 MCG (5000 UT) TABS Take 5,000 Units by mouth daily.    . collagenase (SANTYL) ointment Apply topically daily. (Patient taking differently: Apply 1 application topically 2 (two) times daily. ) 15 g 0  . furosemide (LASIX) 20 MG tablet Take 2 tablets (40 mg total) by mouth daily. (Patient taking differently: Take 40 mg by mouth 2 (two) times daily. ) 30 tablet 0  . insulin aspart (NOVOLOG FLEXPEN) 100 UNIT/ML FlexPen INJECT BEFORE MEALS AND SNACKS 3 TO 5 TIMES DAILY AS NEEDED (2 UNITS FOR EVERY 15 GRAMS CARBS). PEN IN USE EXPIRES 28 DAYS    . Insulin Glargine (LANTUS SOLOSTAR) 100 UNIT/ML Solostar Pen Inject 32 Units into the skin at bedtime.     . metoprolol succinate (TOPROL-XL) 25 MG 24  hr tablet Take 25 mg by mouth daily.    . Multiple Vitamins-Minerals (COMPLETE) TABS Take 1 tablet by mouth daily.    Marland Kitchen omeprazole (PRILOSEC) 20 MG capsule Take 20 mg by mouth daily.    Marland Kitchen saccharomyces boulardii (FLORASTOR) 250 MG capsule Take 250 mg by mouth 2 (two) times daily.    . sodium bicarbonate 650 MG tablet Take 650 mg by mouth 2 (two) times daily.    Marland Kitchen venlafaxine XR (EFFEXOR-XR) 37.5 MG 24 hr capsule Take 2  capsules by mouth daily.    Marland Kitchen gabapentin (NEURONTIN) 100 MG capsule Take 300 mg by mouth 3 (three) times daily as needed.    . methocarbamol (ROBAXIN) 500 MG tablet Take 1 tablet (500 mg total) by mouth every 8 (eight) hours as needed for muscle spasms. 30 tablet 0    Physical Exam: General: Alert and oriented.  No apparent distress.  Vascular: DP/PT pulses +1 bilateral.  Capillary fill time intact to digits bilateral.  No hair growth noted to digits bilateral.  Minimal edema present to the right heel around the periwound area with no corresponding erythema.  Neuro: Light touch sensation reduced to digits bilaterally.  Derm: Stable heel eschar to the posterior/plantar aspect of the right heel.  Unable to determine depth of wound.  Appears stable with no corresponding erythema or openings, mild edema present to right foot/heel, no odor, no drainage.    MSK: Minimal pain on palpation of the right heel.  Results for orders placed or performed during the hospital encounter of 08/04/19 (from the past 48 hour(s))  Type and screen     Status: None   Collection Time: 08/04/19  5:02 PM  Result Value Ref Range   ABO/RH(D) O POS    Antibody Screen NEG    Sample Expiration      08/07/2019,2359 Performed at [facility], [address] [place], [place] [address]   Urinalysis, Complete w Microscopic     Status: Abnormal   Collection Time: 08/04/19  9:34 PM  Result Value Ref Range   Color, Urine AMBER (A) YELLOW    Comment: BIOCHEMICALS MAY BE AFFECTED BY COLOR   APPearance CLOUDY (A) CLEAR   Specific Gravity, Urine 1.017 1.005 - 1.030   pH 6.0 5.0 - 8.0   Glucose, UA >=500 (A) NEGATIVE mg/dL   Hgb urine dipstick LARGE (A) NEGATIVE   Bilirubin Urine NEGATIVE NEGATIVE   Ketones, ur NEGATIVE NEGATIVE mg/dL   Protein, ur 100 (A) NEGATIVE mg/dL   Nitrite POSITIVE (A) NEGATIVE   Leukocytes,Ua LARGE (A) NEGATIVE   RBC / HPF >50 (H) 0 - 5 RBC/hpf   WBC, UA >50 (H) 0 - 5 WBC/hpf    Bacteria, UA RARE (A) NONE SEEN   Squamous Epithelial / LPF 0-5 0 - 5   WBC Clumps PRESENT     Comment: Performed at [facility], [address] [place], [place] [address]  Urine culture     Status: Abnormal (Preliminary result)   Collection Time: 08/04/19  9:34 PM   Specimen: Urine, Random  Result Value Ref Range   Specimen Description URINE, RANDOM    Special Requests      NONE Performed at [facility], [address] [place], [place] [address]    Culture (A)     >=100,000 COLONIES/mL MORGANELLA MORGANII SUSCEPTIBILITIES TO FOLLOW    Report Status PENDING   Comprehensive metabolic panel     Status: Abnormal   Collection Time: 08/05/19  5:04 AM  Result Value Ref Range   Sodium 134 (L) 135 - 145 mmol/L   Potassium 3.9 3.5 - 5.1 mmol/L   Chloride 109 98 - 111 mmol/L   CO2 11 (L) 22 - 32 mmol/L   Glucose, Bld 435 (H) 70 - 99 mg/dL   BUN 93 (H) 8 - 23 mg/dL   Creatinine, Ser 2.75 (H) 0.44 - 1.00 mg/dL   Calcium 8.9 8.9 - 10.3 mg/dL   Total Protein 7.1 6.5 - 8.1 g/dL   Albumin 2.0 (L) 3.5 - 5.0 g/dL   AST 47 (H) 15 - 41 U/L   ALT 67 (H) 0 - 44 U/L   Alkaline Phosphatase 159 (H) 38 - 126 U/L   Total Bilirubin 0.5 0.3 - 1.2 mg/dL   GFR calc non Af Amer 17 (L) >60 mL/min   GFR calc Af Amer 19 (L) >60 mL/min   Anion gap 14 5 - 15    Comment: Performed at [facility], [address] [place], [place] [address]  CBC     Status: Abnormal   Collection Time: 08/05/19  5:04 AM  Result Value Ref Range   WBC 18.0 (H) 4.0 - 10.5 K/uL   RBC 3.46 (L) 3.87 - 5.11 MIL/uL   Hemoglobin 9.7 (L) 12.0 - 15.0 g/dL   HCT 31.6 (L) 36.0 - 46.0 %   MCV 91.3 80.0 - 100.0 fL   MCH 28.0 26.0 - 34.0 pg   MCHC 30.7 30.0 - 36.0 g/dL   RDW 15.4 11.5 - 15.5 %   Platelets 439 (H) 150 - 400 K/uL   nRBC 0.1 0.0 - 0.2 %    Comment: Performed at [facility], [address] [place], [place] [address]  Glucose, capillary     Status: Abnormal   Collection Time:  08/05/19  8:37 AM  Result Value Ref Range   Glucose-Capillary 418 (H) 70 - 99 mg/dL   Comment 1 Notify RN    Comment 2 Document in Chart   Glucose, capillary     Status: Abnormal   Collection Time: 08/05/19 12:23 PM  Result Value Ref Range   Glucose-Capillary 300 (H) 70 - 99 mg/dL  Beta-hydroxybutyric acid     Status: None   Collection Time: 08/05/19  2:49 PM  Result Value Ref Range   Beta-Hydroxybutyric Acid 0.25 0.05 - 0.27 mmol/L    Comment: Performed at [facility], [address] [place], [place] [id number]  Basic metabolic panel     Status: Abnormal   Collection Time: 08/05/19  2:49 PM  Result Value Ref Range   Sodium 133 (L) 135 - 145 mmol/L   Potassium 2.8 (L) 3.5 - 5.1 mmol/L   Chloride 109 98 - 111 mmol/L   CO2 12 (L) 22 - 32 mmol/L   Glucose, Bld 192 (H) 70 - 99 mg/dL   BUN 87 (H) 8 - 23 mg/dL   Creatinine, Ser 2.78 (H) 0.44 - 1.00 mg/dL   Calcium 8.8 (L) 8.9 - 10.3 mg/dL   GFR calc non Af Amer 16 (L) >60 mL/min   GFR calc Af Amer 19 (L) >60 mL/min   Anion gap 12 5 - 15    Comment: Performed at [facility], [address] [place], [name] [address]  Lactic acid, plasma     Status: Abnormal   Collection Time: 08/05/19  2:49 PM  Result Value Ref Range   Lactic Acid, Venous 2.0 (HH) 0.5 - 1.9 mmol/L  Comment: CRITICAL RESULT CALLED TO, READ BACK BY AND VERIFIED WITH ASHLEY SMITH @1555  08/05/19 MJU Performed at [facility], [address] [place], [place] [address]   Magnesium     Status: Abnormal   Collection Time: 08/05/19  2:49 PM  Result Value Ref Range   Magnesium 1.2 (L) 1.7 - 2.4 mg/dL    Comment: Performed at [facility], [address] [place], [place] [address]  Glucose, capillary     Status: Abnormal   Collection Time: 08/05/19  4:03 PM  Result Value Ref Range   Glucose-Capillary 125 (H) 70 - 99 mg/dL  Lactic acid, plasma     Status: Abnormal   Collection Time: 08/05/19  9:21 PM  Result Value Ref Range    Lactic Acid, Venous 2.3 (HH) 0.5 - 1.9 mmol/L    Comment: CRITICAL VALUE NOTED. VALUE IS CONSISTENT WITH PREVIOUSLY REPORTED/CALLED VALUE MJU Performed at [facility], [address] [place], [place] [address]   Glucose, capillary     Status: Abnormal   Collection Time: 08/05/19  9:40 PM  Result Value Ref Range   Glucose-Capillary 169 (H) 70 - 99 mg/dL   Comment 1 Notify RN   CBC     Status: Abnormal   Collection Time: 08/06/19  4:01 AM  Result Value Ref Range   WBC 18.1 (H) 4.0 - 10.5 K/uL   RBC 3.75 (L) 3.87 - 5.11 MIL/uL   Hemoglobin 10.5 (L) 12.0 - 15.0 g/dL   HCT 34.2 (L) 36.0 - 46.0 %   MCV 91.2 80.0 - 100.0 fL   MCH 28.0 26.0 - 34.0 pg   MCHC 30.7 30.0 - 36.0 g/dL   RDW 15.7 (H) 11.5 - 15.5 %   Platelets 525 (H) 150 - 400 K/uL   nRBC 0.0 0.0 - 0.2 %    Comment: Performed at [facility], [address] [place], [place] [address]  Renal function panel     Status: Abnormal   Collection Time: 08/06/19  4:01 AM  Result Value Ref Range   Sodium 137 135 - 145 mmol/L   Potassium 3.8 3.5 - 5.1 mmol/L   Chloride 111 98 - 111 mmol/L   CO2 15 (L) 22 - 32 mmol/L   Glucose, Bld 166 (H) 70 - 99 mg/dL   BUN 77 (H) 8 - 23 mg/dL   Creatinine, Ser 2.66 (H) 0.44 - 1.00 mg/dL   Calcium 9.0 8.9 - 10.3 mg/dL   Phosphorus 2.5 2.5 - 4.6 mg/dL   Albumin 2.0 (L) 3.5 - 5.0 g/dL   GFR calc non Af Amer 17 (L) >60 mL/min   GFR calc Af Amer 20 (L) >60 mL/min   Anion gap 11 5 - 15    Comment: Performed at [facility], [address] [place], [place] [address]  Glucose, capillary     Status: Abnormal   Collection Time: 08/06/19  8:07 AM  Result Value Ref Range   Glucose-Capillary 129 (H) 70 - 99 mg/dL   Comment 1 Notify RN   C difficile quick scan w PCR reflex     Status: None   Collection Time: 08/06/19 10:10 AM   Specimen: STOOL  Result Value Ref Range   C Diff antigen NEGATIVE NEGATIVE   C Diff toxin NEGATIVE NEGATIVE   C Diff interpretation No C.  difficile detected.     Comment: Performed at [facility], [address] [place], [place] [address]  Glucose, capillary  Status: Abnormal   Collection Time: 08/06/19 11:57 AM  Result Value Ref Range   Glucose-Capillary 151 (H) 70 - 99 mg/dL   Comment 1 Notify RN    DG Chest Portable 1 View  Result Date: 08/04/2019 CLINICAL DATA:  Weakness EXAM: PORTABLE CHEST 1 VIEW COMPARISON:  07/14/2019 FINDINGS: The heart size and mediastinal contours are within normal limits. Aortic atherosclerosis. Both lungs are clear. The visualized skeletal structures are unremarkable. IMPRESSION: No active disease. Electronically Signed   By: Donavan Foil M.D.   On: 08/04/2019 17:14    Blood pressure (!) 110/50, pulse 71, temperature 98 F (36.7 C), temperature source Oral, resp. rate 14, height 4\' 10"  (1.473 m), weight 72.6 kg, SpO2 100 %.  Assessment 1. Right decubitus heel ulceration 2. Diabetes type 1 polyneuropathy 3. PVD  Plan -Patient seen and examined. -Ulceration of the posterior aspect of the right heel appears to be a decubitus type of heel ulceration with stable eschar present with no signs of infection.  No drainage present today, no opening seen in the eschar overall.  No erythema, mild edema. -Applied Betadine to the eschar.  Patient instructed on Betadine applications daily to keep the area clean and dry. -Instructed patient also to wear either Prevalon boots or keep pillows underneath legs to maintain pressure off the posterior aspects of the heels as that is caused her pressure ulceration. -X-ray ordered to help gauge the possible depth of wound.  No evidence of osteomyelitis on x-ray examination.  Arterial calcification noted. -Appreciate recommendations per vascular service for peripheral vascular disease.  ABIs ordered.  If abnormal will ask vascular to assess further.  Discharge instructions placed in chart.  Caroline More, DPM 08/06/2019, 4:50 PM

## 2019-08-06 NOTE — Evaluation (Signed)
Physical Therapy Evaluation Patient Details Name: Anne George MRN: [id number] DOB: Nov 12, 1945 Today's Date: 08/06/2019   History of Present Illness  Anne George is a 50yoF who comes to [facility] on 2/15 from [facility] (STR) d/t weakness and vaginal bleeding. Per [facility] Pt was seen by ourr services prior admission, went to STR d/t significan tdeconditioning. Prior to Jan, pt lives alone at home, AMB with [facility] PMH: CAD, HTN, HLD, DM1, DVT on eliquis, anal CA s/p radiation s/p colostomy, uncomplicated [id number] infection Jan 2021.  Clinical Impression  Pt admitted with above diagnosis. Pt currently with functional limitations due to the deficits listed below (see "PT Problem List"). Upon entry, pt in bed, awake and agreeable to participate. The pt is alert and oriented x4, pleasant, conversational, and generally a good historian. Pt seems slightly hypoactive and flat affect. Pt reports gradual steady progress at STR, able to AMB short distances on two 2 sessions, but then was essentially not gotten out of bed thereafter due to persistent bloody drainage upon standing. Pt has grown progressively more weak, this date BLE MMT: 3+/5-4-/5 bilat; Combined hip/knee extension MMT would not have great utility for STS transfers, and pt unable to clear buttocks during bridging. OT recently mobilizing pt to sitting this morning and reporting pt need of MaxA for mobility and sitting. Mobility deferred this session as RN reports ostomy bag malfunction that has not yet been addressed. Functional mobility assessment demonstrates increased effort/time requirements, poor tolerance, and need for physical assistance, whereas the patient performed these at a higher level of independence PTA. Pt agrees that she remains far from her recent baseline and ill-prepared for return to independent living at home. Author recommends return to SNF setting for completion of STR with eventual transition back to home. Pt will benefit  from skilled PT intervention to increase independence and safety with basic mobility in preparation for discharge to the venue listed below.       Follow Up Recommendations SNF;Supervision for mobility/OOB;Supervision - Intermittent    Equipment Recommendations  None recommended by PT    Recommendations for Other Services       Precautions / Restrictions Precautions Precautions: Fall Precaution Comments: colostomy; elevate LEs Restrictions Weight Bearing Restrictions: No      Mobility  Bed Mobility Overal bed mobility: (defered; RN reports ostomoy bag is leaking) Bed Mobility: Supine to Sit;Sit to Supine     Supine to sit: Max assist;HOB elevated Sit to supine: Max assist      Transfers                 General transfer comment: transfers deferred d/t pt with poor static sitting balance at EOB requiring MAX A to maintain  Ambulation/Gait                Stairs            Wheelchair Mobility    Modified Rankin (Stroke Patients Only)       Balance Overall balance assessment: Needs assistance Sitting-balance support: Feet supported;Single extremity supported Sitting balance-Leahy Scale: Poor Sitting balance - Comments: requires UE support, demos R lateral lean, requires MAX A to maintain static sitting balance.       Standing balance comment: unable to assess                             Pertinent Vitals/Pain Pain Assessment: Faces(chronic LBP) Faces Pain Scale: Hurts a little bit Pain Location:  back pain with mobilization to EOB sitting Pain Descriptors / Indicators: Aching;Grimacing Pain Intervention(s): Monitored during session;Premedicated before session    [place] expects to be discharged to:: Skilled nursing facility Living Arrangements: Other (Comment) Available Help at Discharge: Family;Available PRN/intermittently Type of Home: Mobile home Home Access: Stairs to enter Entrance Stairs-Rails:  Right;Left;Can reach both Entrance Stairs-Number of Steps: 4-5   Home Equipment: Cane - single point;Shower seat;Walker - 4 wheels;Bedside commode Additional Comments: Pt reports that she was primarily using [place] for fxl mobility and did not use BSC or shower chair at baseline.    Prior Function Level of Independence: Independent with assistive device(s)         Comments: States she was performing all ADLs/ADL mobility I'ly. Her Son and dtr in law helped with IADLs including transportation and heavy lifting tasks like laundry. However, since recent hospitalization and stay at [facility], endorses decline in mobility/tolerance.     Hand Dominance   Dominant Hand: Right    Extremity/Trunk Assessment   Upper Extremity Assessment Upper Extremity Assessment: Generalized weakness    Lower Extremity Assessment Lower Extremity Assessment: Generalized weakness    Cervical / Trunk Assessment Cervical / Trunk Assessment: Kyphotic  Communication   Communication: No difficulties  Cognition Arousal/Alertness: Awake/alert Behavior During Therapy: WFL for tasks assessed/performed Overall Cognitive Status: Within Functional Limits for tasks assessed                                 General Comments: pt A&O x4, very oriented to medications, timeline of hospitalizations, very good source of PLOF information.      General Comments      Exercises Total Joint Exercises Bridges: Strengthening;Both;5 reps;Supine;Limitations Bridges Limitations: unable to perform full lift-off; isometric hold only General Exercises - Lower Extremity Ankle Circles/Pumps: AROM;Strengthening;Both;Supine;15 reps Heel Slides: Strengthening;Both;Supine;5 reps;AAROM Hip ABduction/ADduction: AAROM;Strengthening;Both;Supine;5 reps Mini-Sqauts: Strengthening;Both;5 reps;Supine Other Exercises Other Exercises: OT facilitates pt participation in seated straight arm rasises with OT providing MAX A seated  support with pt completing 1 set x5 reps bilaterally. Other Exercises: OT facilitates pt participation in trunk extension for 1 set x5 reps in EOB sitting with OT providing seated support to improve surface area for deeper breaths. Pt requires MIN/MOD verbal/tactile cues.   Assessment/Plan    PT Assessment Patient needs continued PT services  PT Problem List Decreased strength;Decreased activity tolerance;Decreased balance;Decreased mobility;Decreased knowledge of use of DME;Cardiopulmonary status limiting activity;Decreased knowledge of precautions       PT Treatment Interventions DME instruction;Gait training;Stair training;Functional mobility training;Therapeutic activities;Therapeutic exercise;Balance training;Patient/family education    PT Goals (Current goals can be found in the Care Plan section)  Acute Rehab PT Goals Patient Stated Goal: return to baseline level of independence PT Goal Formulation: With patient Time For Goal Achievement: 08/20/19 Potential to Achieve Goals: Fair    Frequency Min 2X/week   Barriers to discharge Decreased caregiver support      Co-evaluation               AM-PAC PT "6 Clicks" Mobility  Outcome Measure Help needed turning from your back to your side while in a flat bed without using bedrails?: Total Help needed moving from lying on your back to sitting on the side of a flat bed without using bedrails?: Total Help needed moving to and from a bed to a chair (including a wheelchair)?: Total Help needed standing up from a chair using your arms (e.g.,  wheelchair or bedside chair)?: Total Help needed to walk in hospital room?: Total Help needed climbing 3-5 steps with a railing? : Total 6 Click Score: 6    End of Session   Activity Tolerance: Patient tolerated treatment well;No increased pain Patient left: in bed;with call bell/phone within reach;with bed alarm set;Other (comment) Nurse Communication: Precautions PT Visit Diagnosis:  Other abnormalities of gait and mobility (R26.89);Muscle weakness (generalized) (M62.81);Difficulty in walking, not elsewhere classified (R26.2)    Time: [id number] PT Time Calculation (min) (ACUTE ONLY): 16 min   Charges:   PT Evaluation $PT Eval Moderate Complexity: 1 Mod          12:32 PM, 08/06/19 Etta Grandchild, PT, DPT Physical Therapist - [facility]  [phone] ([place])    Daney Moor C 08/06/2019, 12:27 PM

## 2019-08-06 NOTE — Consult Note (Signed)
WOC Nurse Consult Note: Reason for Consult: sacrum; buttocks, heel Wound type: Stage 3 Pressure Injury; sacrum Stage 3 Pressure Injury; left buttock Unstageable Pressure Injury: right heel  Pressure Injury POA: Yes Measurement: Sacrum: 3cm x 2cm x 0.4cm  Left buttock: 3.5cm x 20.cm x 0.2cm  Right heel; 2.5cm x 2.0cm x 0cm  Wound bed: Sacrum and buttock wounds both 100% pink, clean Right heel: 100% black stable eschar Drainage (amount, consistency, odor) minimal serosanguinous from the sacrum and buttock wounds Periwound:intact  Dressing procedure/placement/frequency: Float heels at all times  Paint right heel with betadine and allow to air dry Add Prevalon boots if floating on pillows is not successful  Pack sacral wound with silver hydrofiber, cover with foam. Change every other day.  Re consult if needed, will not follow at this time. Thanks  Suraya Vidrine [employer], RN,CWOCN, CNS, [place] [phone])

## 2019-08-06 NOTE — TOC Progression Note (Signed)
Transition of Care [facility]) - Progression Note    Patient Details  Name: Anne George MRN: [id number] Date of Birth: 04/18/1946  [facility] CM/SW Contact  Kieth Hartis, Gardiner Rhyme, LCSW Phone Number: 08/06/2019, 2:43 PM  Clinical Narrative:   Met with pt to discuss discharge plan. She and son want her to go to [facility] if they can offer her a bed. Have sent them the new FL2 and will see what they can do. Pt has used 13 SNF days of her 20 days at [facility]. Pt does need rehab due to deconditioning and son has a back issue. Will work on appropriate discharge plan follow along while here.    Expected Discharge Plan: [place]    Expected Discharge Plan and Services Expected Discharge Plan: [place] arrangements for the past 2 months: [place]                                       Social Determinants of Health (SDOH) Interventions    Readmission Risk Interventions No flowsheet data found.

## 2019-08-06 NOTE — NC FL2 (Signed)
[place] LEVEL OF CARE SCREENING TOOL     IDENTIFICATION  Patient Name: Anne George Birthdate: 07-02-1945 Sex: female Admission Date (Current Location): 08/04/2019  [place] and [place] Number:  Engineering geologist and Address:  [facility], [address] [place], [place] [address]      Provider Number: [id number]  Attending Physician Name and Address:  Loletha Grayer, MD  Relative Name and Phone Number:  Jannette Spanner [phone]    Current Level of Care: Hospital Recommended Level of Care: [place] [facility] Prior Approval Number:    Date Approved/Denied:   PASRR Number: [id number]  Discharge Plan: SNF    Current Diagnoses: Patient Active Problem List   Diagnosis Date Noted  . Pressure injury of skin 08/06/2019  . Generalized weakness 08/04/2019  . Hyperosmolar non-ketotic state in patient with type 2 diabetes mellitus ([place]) 07/14/2019  . DVT, lower extremity, proximal, acute, left ([place]) 07/14/2019  . COVID-19 virus infection 07/14/2019  . Acute renal failure superimposed on stage 4 chronic kidney disease ([facility]) 07/14/2019  . Essential hypertension 07/14/2019  . FTT (failure to thrive) in adult 07/14/2019  . Obesity (BMI 30.0-34.9) 07/14/2019  . Skin ulcer of sacrum, limited to breakdown of skin ([place])   . Mild nonproliferative diabetic retinopathy of both eyes without macular edema associated with type 1 diabetes mellitus ([place]) 07/03/2018  . Cellulitis of left lower extremity 03/05/2018  . Venous stasis 11/29/2017  . Hyperopia with astigmatism and presbyopia, bilateral 03/20/2017  . Chronic kidney disease (CKD) stage G4/A1, severely decreased glomerular filtration rate (GFR) between 15-29 mL/min/1.73 square meter and albuminuria creatinine ratio less than 30 mg/g (HCC) 12/28/2016  . Coronary artery disease 08/22/2013  . Back pain 12/12/2012  . Hyperkalemia 03/15/2012  . Hypercholesterolemia 08/24/2011  .  History of rectal cancer 05/23/2011  . Ovarian failure 03/18/2010  . Symptomatic menopausal or female climacteric states 03/18/2010  . Acquired spondylolisthesis 03/01/2010  . Edema 03/18/2006  . Peptic ulcer 05/15/1995  . Osteoarthrosis 08/13/1992  . 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 (HCC) [id number]    Orientation RESPIRATION BLADDER Height & Weight     Self, Time, Situation, Place  Normal Continent Weight: 160 lb (72.6 kg) Height:  4\' 10"  (147.3 cm)  BEHAVIORAL SYMPTOMS/MOOD NEUROLOGICAL BOWEL NUTRITION STATUS      Colostomy Diet(Heart healthy thin liquids)  AMBULATORY STATUS COMMUNICATION OF NEEDS Skin   Extensive Assist Verbally Skin abrasions, PU Stage and Appropriate Care                       Personal Care Assistance Level of Assistance  Bathing, Dressing Bathing Assistance: Limited assistance   Dressing Assistance: Limited assistance     Functional Limitations Info             SPECIAL CARE FACTORS FREQUENCY  PT (By licensed PT), OT (By licensed OT)     PT Frequency: 5x week OT Frequency: 5x week            Contractures Contractures Info: Not present    Additional Factors Info  Code Status, Allergies Code Status Info: Full Allergies Info: NKA           Current Medications (08/06/2019):  This is the current hospital active medication list Current Facility-Administered Medications  Medication Dose Route Frequency Provider Last Rate Last Admin  . acetaminophen (TYLENOL) tablet 650 mg  650 mg Oral Q8H PRN Elwyn Reach, MD      .  amLODipine (NORVASC) tablet 2.5 mg  2.5 mg Oral Daily Gala Romney L, MD   2.5 mg at 08/06/19 0917  . ascorbic acid (VITAMIN C) tablet 500 mg  500 mg Oral BID Elwyn Reach, MD   500 mg at 08/06/19 0918  . atorvastatin (LIPITOR) tablet 80 mg  80 mg Oral Daily Elwyn Reach, MD   80 mg at 08/06/19 0917  . cefTRIAXone (ROCEPHIN) 1 g in sodium chloride 0.9 % 100 mL IVPB  1 g Intravenous Q24H  Elwyn Reach, MD   Stopped at 08/05/19 2302  . cholecalciferol (VITAMIN D) tablet 5,000 Units  5,000 Units Oral Daily Elwyn Reach, MD   5,000 Units at 08/06/19 0916  . collagenase (SANTYL) ointment 1 application  1 application Topical BID Elwyn Reach, MD   1 application at 0000000 0920  . feeding supplement (PRO-STAT SUGAR FREE 64) liquid 30 mL  30 mL Oral TID WC Gala Romney L, MD   30 mL at 08/06/19 1218  . gabapentin (NEURONTIN) capsule 300 mg  300 mg Oral TID Elwyn Reach, MD   300 mg at 08/06/19 0921  . insulin aspart (novoLOG) injection 0-5 Units  0-5 Units Subcutaneous QHS Wieting, Richard, MD      . insulin aspart (novoLOG) injection 0-6 Units  0-6 Units Subcutaneous TID WC Wieting, Richard, MD      . insulin aspart (novoLOG) injection 4 Units  4 Units Subcutaneous TID WC Lorella Nimrod, MD   4 Units at 08/06/19 1218  . insulin glargine (LANTUS) injection 32 Units  32 Units Subcutaneous QHS Elwyn Reach, MD   32 Units at 08/05/19 2228  . methocarbamol (ROBAXIN) tablet 500 mg  500 mg Oral Q8H PRN Gala Romney L, MD      . metoprolol succinate (TOPROL-XL) 24 hr tablet 25 mg  25 mg Oral Daily Gala Romney L, MD   25 mg at 08/06/19 [id number]  . multivitamin with minerals tablet 1 tablet  1 tablet Oral Daily Elwyn Reach, MD   1 tablet at 08/06/19 0917  . ondansetron (ZOFRAN) tablet 4 mg  4 mg Oral Q6H PRN Elwyn Reach, MD       Or  . ondansetron (ZOFRAN) injection 4 mg  4 mg Intravenous Q6H PRN Gala Romney L, MD      . pantoprazole (PROTONIX) EC tablet 40 mg  40 mg Oral Daily Elwyn Reach, MD   40 mg at 08/06/19 0918  . saccharomyces boulardii (FLORASTOR) capsule 250 mg  250 mg Oral BID Gala Romney L, MD   250 mg at 08/06/19 0918  . sodium bicarbonate 150 mEq in sterile water 1,000 mL infusion   Intravenous Continuous Lorella Nimrod, MD 100 mL/hr at 08/06/19 1019 New Bag at 08/06/19 1019  . sodium bicarbonate tablet 650 mg  650 mg Oral BID  Elwyn Reach, MD   650 mg at 08/06/19 0918  . venlafaxine XR (EFFEXOR-XR) 24 hr capsule 75 mg  75 mg Oral Daily Elwyn Reach, MD   75 mg at 08/06/19 [id number]     Discharge Medications: Please see discharge summary for a list of discharge medications.  Relevant Imaging Results:  Relevant Lab Results:   Additional Information SSN: [id number]  Contact had COVID 1/13.2021- MRSA  Kodee Drury, Gardiner Rhyme, LCSW

## 2019-08-06 NOTE — Consult Note (Signed)
Consult History and Physical   SERVICE: Gynecology   Patient Name: Anne George Patient MRN:   [id number]  CC: Postmenopausal bleeding  HPI: Anne George is a 74 y.o. para 2 presenting with vaginal bleeding. She is on Eliquis 5mg  PO BID and ASA for a hx of left lower ext DVT 07/14/19. 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   S/p total abdominal hysterectomy with bilateral salpingoophorectomy in 2013 at time of anal cancer with significant infections postop.   She was most recently in SNF after inpatient stay 2 weeks ago where she was dx with extensive DVT and placed on eliquis. They have held the anticoagulants with consideration for IVC filter.   Hx of 3 cesarean sections. Lost of oldest child in neonatal period. Two surviving adult sons; Anne George, her youngest, is her health care power of attorney.   Past Obstetrical History: OB History   [id number], s/p 3 c/s     Past Gynecologic History: No LMP recorded. Patient is postmenopausal.  Past Medical History: Past Medical History:  Diagnosis Date  . Diabetes mellitus without complication [facility])     Past Surgical History:   Past Surgical History:  Procedure Laterality Date  . PERIPHERAL VASCULAR THROMBECTOMY Left 07/17/2019   Procedure: PERIPHERAL VASCULAR THROMBECTOMY / THROMBOLYSIS;  Surgeon: Algernon Huxley, MD;  Location: [facility];  Service: Cardiovascular;  Laterality: Left;    Family History:  family history is not on file.  Social History:  Social History   Socioeconomic History  . Marital status: Widowed    Spouse name: Not on file  . Number of children: Not on file  . Years of education: Not on file  . Highest education level: Not on file  Occupational History  . Not on file  Tobacco Use  . Smoking status: Never Smoker  . Smokeless tobacco: Never Used  Substance and Sexual Activity  .  Alcohol use: Never  . Drug use: Never  . Sexual activity: Not Currently  Other Topics Concern  . Not on file  Social History Narrative  . Not on file   Social Determinants of Health   Financial Resource Strain:   . Difficulty of Paying Living Expenses: Not on file  Food Insecurity:   . Worried About Charity fundraiser in the Last Year: Not on file  . Ran Out of Food in the Last Year: Not on file  Transportation Needs:   . Lack of Transportation (Medical): Not on file  . Lack of Transportation (Non-Medical): Not on file  Physical Activity:   . Days of Exercise per Week: Not on file  . Minutes of Exercise per Session: Not on file  Stress:   . Feeling of Stress : Not on file  Social Connections:   . Frequency of Communication with Friends and Family: Not on file  . Frequency of Social Gatherings with Friends and Family: Not on file  . Attends Religious Services: Not on file  . Active Member of Clubs or Organizations: Not on file  . Attends Archivist Meetings: Not on file  . Marital Status: Not on file  Intimate Partner Violence:   . Fear of Current or Ex-Partner: Not on file  . Emotionally Abused: Not on file  . Physically Abused: Not on file  . Sexually Abused: Not on file    Home Medications:  Medications reconciled  in EPIC  No current facility-administered medications on file prior to encounter.   Current Outpatient Medications on File Prior to Encounter  Medication Sig Dispense Refill  . acetaminophen (TYLENOL) 650 MG CR tablet Take 650 mg by mouth every 8 (eight) hours as needed for pain.    . Amino Acids-Protein Hydrolys (FEEDING SUPPLEMENT, PRO-STAT SUGAR FREE 64,) LIQD Take 30 mLs by mouth 3 (three) times daily with meals.    Marland Kitchen amLODipine (NORVASC) 2.5 MG tablet Take 2.5 mg by mouth daily.    Marland Kitchen apixaban (ELIQUIS) 5 MG TABS tablet Take 2 tablets (10mg ) twice daily for 7 days, then 1 tablet (5mg ) twice daily 60 tablet 0  . ascorbic acid (VITAMIN C) 500 MG  tablet Take 500 mg by mouth 2 (two) times daily.    Marland Kitchen aspirin 81 MG EC tablet Take 81 mg by mouth daily.    Marland Kitchen atorvastatin (LIPITOR) 80 MG tablet Take 80 mg by mouth daily.    . Cholecalciferol 125 MCG (5000 UT) TABS Take 5,000 Units by mouth daily.    . collagenase (SANTYL) ointment Apply topically daily. (Patient taking differently: Apply 1 application topically 2 (two) times daily. ) 15 g 0  . furosemide (LASIX) 20 MG tablet Take 2 tablets (40 mg total) by mouth daily. (Patient taking differently: Take 40 mg by mouth 2 (two) times daily. ) 30 tablet 0  . insulin aspart (NOVOLOG FLEXPEN) 100 UNIT/ML FlexPen INJECT BEFORE MEALS AND SNACKS 3 TO 5 TIMES DAILY AS NEEDED (2 UNITS FOR EVERY 15 GRAMS CARBS). PEN IN USE EXPIRES 28 DAYS    . Insulin Glargine (LANTUS SOLOSTAR) 100 UNIT/ML Solostar Pen Inject 32 Units into the skin at bedtime.     . metoprolol succinate (TOPROL-XL) 25 MG 24 hr tablet Take 25 mg by mouth daily.    . Multiple Vitamins-Minerals (COMPLETE) TABS Take 1 tablet by mouth daily.    Marland Kitchen omeprazole (PRILOSEC) 20 MG capsule Take 20 mg by mouth daily.    Marland Kitchen saccharomyces boulardii (FLORASTOR) 250 MG capsule Take 250 mg by mouth 2 (two) times daily.    . sodium bicarbonate 650 MG tablet Take 650 mg by mouth 2 (two) times daily.    Marland Kitchen venlafaxine XR (EFFEXOR-XR) 37.5 MG 24 hr capsule Take 2 capsules by mouth daily.    Marland Kitchen gabapentin (NEURONTIN) 100 MG capsule Take 300 mg by mouth 3 (three) times daily as needed.    . methocarbamol (ROBAXIN) 500 MG tablet Take 1 tablet (500 mg total) by mouth every 8 (eight) hours as needed for muscle spasms. 30 tablet 0    Allergies:  No Known Allergies  Physical Exam:  Temp:  [97.5 F (36.4 C)-98 F (36.7 C)] 98 F (36.7 C) (02/17 1555) Pulse Rate:  [71-88] 71 (02/17 1555) Resp:  [14-17] 14 (02/17 1555) BP: (110-121)/(49-50) 110/50 (02/17 1555) SpO2:  [100 %] 100 % (02/17 1555)   General Appearance:  Well developed, poorly nourished, no acute  distress, alert and oriented x3, eating dinner HEENT:  Normocephalic atraumatic, extraocular movements intact  Abdomen:  Leaking colostomy, no cellulitis, soft and not-distended. Extremities:  Thigh soft.  Calf soft.  Extremities warm distally toes.  Nontender to palpation.  No erythema.  Mild edema. Right ankle TTP Skin:  normal coloration and turgor, no rashes, no suspicious skin lesions noted  Neurologic:   Psychiatric:  Normal mood and affect, appropriate, no AH/VH Pelvic:  Vulvovaginal atrophy noted, with well-healed and intact vaginal cuff. No evidence of blood in  the vaginal vault. Normal discharge, clear, without evidence of infection. Urethral opening patent. Pink-tinged blood in cath bag.   Labs/Studies:   CBC and Coags:  Lab Results  Component Value Date   WBC 18.1 (H) 08/06/2019   NEUTOPHILPCT 65 08/04/2019   EOSPCT 1 08/04/2019   BASOPCT 0 08/04/2019   LYMPHOPCT 9 08/04/2019   HGB 10.5 (L) 08/06/2019   HCT 34.2 (L) 08/06/2019   MCV 91.2 08/06/2019   PLT 525 (H) 08/06/2019   INR 1.7 (H) 08/04/2019   CMP:  Lab Results  Component Value Date   NA 137 08/06/2019   K 3.8 08/06/2019   CL 111 08/06/2019   CO2 15 (L) 08/06/2019   BUN 77 (H) 08/06/2019   CREATININE 2.66 (H) 08/06/2019   CREATININE 2.78 (H) 08/05/2019   CREATININE 2.75 (H) 08/05/2019   PROT 7.1 08/05/2019   BILITOT 0.5 08/05/2019   ALT 67 (H) 08/05/2019   AST 47 (H) 08/05/2019   ALKPHOS 159 (H) 08/05/2019    Other Imaging: PERIPHERAL VASCULAR CATHETERIZATION  Result Date: 07/17/2019 See op note  US Venous Img Lower Bilateral (DVT)  Result Date: 07/14/2019 CLINICAL DATA:  74 year old female with bilateral LOWER extremity pain for 3 months. EXAM: BILATERAL LOWER EXTREMITY VENOUS DOPPLER ULTRASOUND TECHNIQUE: Gray-scale sonography with compression, as well as color and duplex ultrasound, were performed to evaluate the deep venous system(s) from the level of the common femoral vein through the  popliteal and proximal calf veins. COMPARISON:  None. FINDINGS: RIGHT: Normal compressibility of the common femoral, superficial femoral, and popliteal veins, as well as the visualized calf veins. Visualized portions of profunda femoral vein and great saphenous vein unremarkable. No filling defects to suggest DVT on grayscale or color Doppler imaging. Doppler waveforms show normal direction of venous flow, normal respiratory phasicity and response to augmentation. LEFT: Occlusive DVT is noted throughout the LEFT LOWER extremity including the common femoral, profundus femoral, femoral, popliteal and visualized calf veins. IMPRESSION: 1. Occlusive DVT throughout the visualized LEFT LOWER extremity. 2. No evidence of RIGHT LOWER extremity DVT. Critical Value/emergent results were called by telephone at the time of interpretation on 07/14/2019 at 5:59 pm to [facility] , who verbally acknowledged these results. Electronically Signed   By: Margarette Canada M.D.   On: 07/14/2019 18:03   DG Chest Portable 1 View  Result Date: 08/04/2019 CLINICAL DATA:  Weakness EXAM: PORTABLE CHEST 1 VIEW COMPARISON:  07/14/2019 FINDINGS: The heart size and mediastinal contours are within normal limits. Aortic atherosclerosis. Both lungs are clear. The visualized skeletal structures are unremarkable. IMPRESSION: No active disease. Electronically Signed   By: Donavan Foil M.D.   On: 08/04/2019 17:14   DG Chest Portable 1 View  Result Date: 07/14/2019 CLINICAL DATA:  Recent COVID-19 positive.  Hyperglycemia. EXAM: PORTABLE CHEST 1 VIEW COMPARISON:  None. FINDINGS: Lungs are clear. Heart size and pulmonary vascularity are normal. No adenopathy. There is aortic atherosclerosis. No bone lesions. IMPRESSION: Lungs clear. Cardiac silhouette normal. No adenopathy. Aortic Atherosclerosis (ICD10-I70.0). Electronically Signed   By: Lowella Grip III M.D.   On: 07/14/2019 13:17   VAS [place] LOWER EXTREMITY VENOUS (DVT)  Result  Date: 08/01/2019  Lower Venous DVTStudy Risk Factors: Surgery 07/17/2019: Mechanical Thrombectomy to the Left Popliteal vein, SFV, CFV and Iliac veins. PTA of the Left Iliac vein. Stent placemant to the Left External Iliac vein. Comparison Study: 07/14/2019 Performing Technologist: Almira Coaster RVS  Examination Guidelines: A complete evaluation includes B-mode imaging, spectral Doppler, color Doppler, and  power Doppler as needed of all accessible portions of each vessel. Bilateral testing is considered an integral part of a complete examination. Limited examinations for reoccurring indications may be performed as noted. The reflux portion of the exam is performed with the patient in reverse Trendelenburg.  +---------+---------------+---------+-----------+----------+--------------+ LEFT     CompressibilityPhasicitySpontaneityPropertiesThrombus Aging +---------+---------------+---------+-----------+----------+--------------+ CFV      Full           Yes      Yes                                 +---------+---------------+---------+-----------+----------+--------------+ SFJ      Full           Yes      Yes                                 +---------+---------------+---------+-----------+----------+--------------+ FV Prox  Full           Yes      Yes                                 +---------+---------------+---------+-----------+----------+--------------+ FV Mid   Full           Yes      Yes                                 +---------+---------------+---------+-----------+----------+--------------+ FV DistalFull           Yes      Yes                                 +---------+---------------+---------+-----------+----------+--------------+ PFV      Full           Yes      Yes                                 +---------+---------------+---------+-----------+----------+--------------+ POP      Full           Yes      Yes                                  +---------+---------------+---------+-----------+----------+--------------+ PTV      Full           Yes      Yes                                 +---------+---------------+---------+-----------+----------+--------------+ PERO     Full           Yes      Yes                                 +---------+---------------+---------+-----------+----------+--------------+ GSV      Full           Yes      Yes                                 +---------+---------------+---------+-----------+----------+--------------+  Summary: LEFT: - Findings appear improved from previous examination. - There is no evidence of deep vein thrombosis in the lower extremity. - There is no evidence of superficial venous thrombosis.  *See table(s) above for measurements and observations. Electronically signed by Leotis Pain MD on 08/01/2019 at 8:31:48 AM.    Final      Assessment / Plan:   Anne George is a 74 y.o. [phone] with a hx of rectal cancer 8 years ago on anticoagulants who presents with concern for vaginal bleeding. She is s/p TAH/BSO in 2013.  1. Vaginal exam benign. No evidence of malignancy recurrence, vesicovaginal fistula, rectovaginal fistula, vaginal cuff dehiscence or vaginal infection. Because she is s/p hysterectomy with a benign vaginal musoca, endometrial cancer not likely, nor is recurrence of rectal cancer to the vaginal mucosa. Suspect mucosal bleeding from genitourinary syndrome of menopause complicated by blood thinners, now resolved. However, significant gross hematuria noted, and suspect UTI and eliquis leading to hematuria.  Spoke with son Anne George [phone]) and all questions of his and patients answered.  Thank you for the opportunity to be involved with this pt's care.   Total amount of time spent on this consult, including review of records, interviewing patient and family, and exam with documentation was 45 minutes.

## 2019-08-06 NOTE — Evaluation (Signed)
Occupational Therapy Evaluation Patient Details Name: Anne George MRN: [id number] DOB: [birth year] Today's Date: 08/06/2019    History of Present Illness Pt is 74 y/o F with PMH: CAD, HTN, T2DM, recent DVT w/ vascular intervention, recent COVID-19 infection (~1 MA). In addition, pt recently admitted to the hospital and discharged about 2 weeks ago.  Presented to ED 08/04/2019 complaining of generalized weakness and noticing some blood clot suspected to be from vaginal. Pt mentioned the bleeding happens when she stands up (on Eliquis and Aspirin). Admitted for failure to thrive, generalized weakness, UTI and vaginal bleeding.   Clinical Impression   Pt was seen for OT evaluation this date. Prior to last hospital admission, pt was apparently walking with [facility] and performing self care I'ly with family support for IADLs. Prior to this hospitalization, pt was at [facility] receiving rehab and endorses decline in strength and tolerance. Pt lives in mobile home with 4-5 steps to enter with bilateral railing. Currently pt demonstrates impairments as described below (See OT problem list) which functionally limit her ability to perform ADL/self-care tasks. Pt currently requires MAX A with bed mobility, MAX A for static sitting balance, MAX A with LB ADLs.  Pt would benefit from skilled OT to address noted impairments and functional limitations (see below for any additional details) in order to maximize safety and independence while minimizing falls risk and caregiver burden. Upon hospital discharge, recommend STR to maximize pt safety and return to PLOF. Of note: family able to provide some support and willing to take pt, but pt does report that her son has recently been taken out of work d/t back injury and should not be doing heavy lifting.    Follow Up Recommendations  SNF    Equipment Recommendations  3 in 1 bedside commode    Recommendations for Other Services       Precautions / Restrictions  Precautions Precautions: Fall Precaution Comments: colostomy; elevate LEs Restrictions Weight Bearing Restrictions: No      Mobility Bed Mobility Overal bed mobility: Needs Assistance Bed Mobility: Supine to Sit;Sit to Supine     Supine to sit: Max assist;HOB elevated Sit to supine: Max assist      Transfers                 General transfer comment: transfers deferred d/t pt with poor static sitting balance at EOB requiring MAX A to maintain    Balance Overall balance assessment: Needs assistance Sitting-balance support: Feet supported;Single extremity supported Sitting balance-Leahy Scale: Poor Sitting balance - Comments: requires UE support, demos R lateral lean, requires MAX A to maintain static sitting balance.       Standing balance comment: unable to assess                           ADL either performed or assessed with clinical judgement   ADL Overall ADL's : Needs assistance/impaired                                       General ADL Comments: Pt currently requires MAX A for all LB ADLs, requires MIN A at bed level with HOB elevated with self feeding to cut up food d/t hand arthristis. MAX A for bed mobility, MAX A for static sitting balance at EOB, unable to participate in seated ADLs at this time as all efforts allotted  for sitting balance.     Vision Patient Visual Report: No change from baseline       Perception     Praxis      Pertinent Vitals/Pain Pain Assessment: Faces Faces Pain Scale: Hurts even more Pain Location: back pain with mobilization to EOB sitting Pain Descriptors / Indicators: Aching;Grimacing Pain Intervention(s): Limited activity within patient's tolerance;Monitored during session;Repositioned     Hand Dominance     Extremity/Trunk Assessment Upper Extremity Assessment Upper Extremity Assessment: Generalized weakness   Lower Extremity Assessment Lower Extremity Assessment: Generalized  weakness   Cervical / Trunk Assessment Cervical / Trunk Assessment: Kyphotic   Communication Communication Communication: No difficulties   Cognition Arousal/Alertness: Awake/alert Behavior During Therapy: WFL for tasks assessed/performed Overall Cognitive Status: Within Functional Limits for tasks assessed                                 General Comments: pt A&O x4, very oriented to medications, timeline of hospitalizations, very good source of PLOF information.   General Comments       Exercises Other Exercises Other Exercises: OT facilitates pt participation in seated straight arm rasises with OT providing MAX A seated support with pt completing 1 set x5 reps bilaterally. Other Exercises: OT facilitates pt participation in trunk extension for 1 set x5 reps in EOB sitting with OT providing seated support to improve surface area for deeper breaths. Pt requires MIN/MOD verbal/tactile cues.   Shoulder Instructions      Home Living Family/patient expects to be discharged to:: Skilled nursing facility Living Arrangements: Other (Comment) Available Help at Discharge: Family;Available PRN/intermittently(pt's son and dtr in law available to help some, but pt reports that son with injured back and currently out of work, cannot help with trasnferring pt.) Type of Home: Mobile home Home Access: Stairs to enter [employer] of Steps: 4-5 Entrance Stairs-Rails: Right;Left;Can reach both       Bathroom Shower/Tub: Occupational psychologist: Standard     Home Equipment: Cane - single point;Shower seat;Walker - 4 wheels;Bedside commode   Additional Comments: Pt reports that she was primarily using [place] for fxl mobility and did not use BSC or shower chair at baseline.      Prior Functioning/Environment          Comments: States she was performing all ADLs/ADL mobility I'ly. Her Son and dtr in law helped with IADLs including transportation and heavy  lifting tasks like laundry. However, since recent hospitalization and stay at [facility], endorses decline in mobility/tolerance.        OT Problem List: Decreased strength;Decreased activity tolerance;Impaired balance (sitting and/or standing);Decreased knowledge of use of DME or AE;Pain      OT Treatment/Interventions: Self-care/ADL training;Therapeutic exercise;DME and/or AE instruction;Energy conservation;Patient/family education;Balance training;Therapeutic activities    OT Goals(Current goals can be found in the care plan section) Acute Rehab OT Goals Patient Stated Goal: to get stronger OT Goal Formulation: With patient Time For Goal Achievement: 08/20/19 Potential to Achieve Goals: Good  OT Frequency: Min 2X/week   Barriers to D/C:            Co-evaluation              AM-PAC OT "6 Clicks" Daily Activity     Outcome Measure Help from another person eating meals?: A Little Help from another person taking care of personal grooming?: A Little Help from another person toileting, which includes  using toliet, bedpan, or urinal?: A Lot Help from another person bathing (including washing, rinsing, drying)?: A Lot Help from another person to put on and taking off regular upper body clothing?: A Lot Help from another person to put on and taking off regular lower body clothing?: A Lot 6 Click Score: 14   End of Session Nurse Communication: Mobility status  Activity Tolerance: Patient limited by pain Patient left: in bed;with call bell/phone within reach;with bed alarm set  OT Visit Diagnosis: Other abnormalities of gait and mobility (R26.89);Muscle weakness (generalized) (M62.81)                Time: [id number] OT Time Calculation (min): 53 min Charges:  OT General Charges $OT Visit: 1 Visit OT Evaluation $OT Eval Moderate Complexity: 1 Mod OT Treatments $Self Care/Home Management : 23-37 mins $Therapeutic Activity: 8-22 mins  Gerrianne Scale, MS, OTR/L ascom  [phone] 08/06/19, 11:38 AM

## 2019-08-06 NOTE — Plan of Care (Signed)

## 2019-08-07 ENCOUNTER — Encounter: Payer: Medicare PPO | Admitting: Occupational Therapy

## 2019-08-07 ENCOUNTER — Encounter
Admission: EM | Disposition: A | Discharge status: Skilled Nursing Facility | Payer: Self-pay | Source: Skilled Nursing Facility | Attending: Internal Medicine

## 2019-08-07 ENCOUNTER — Inpatient Hospital Stay: Payer: Medicare PPO

## 2019-08-07 ENCOUNTER — Other Ambulatory Visit (INDEPENDENT_AMBULATORY_CARE_PROVIDER_SITE_OTHER): Payer: Self-pay | Admitting: Vascular Surgery

## 2019-08-07 ENCOUNTER — Encounter: Payer: Self-pay | Admitting: Internal Medicine

## 2019-08-07 DIAGNOSIS — I82409 Acute embolism and thrombosis of unspecified deep veins of unspecified lower extremity: Secondary | ICD-10-CM

## 2019-08-07 DIAGNOSIS — I824Z2 Acute embolism and thrombosis of unspecified deep veins of left distal lower extremity: Secondary | ICD-10-CM

## 2019-08-07 DIAGNOSIS — I10 Essential (primary) hypertension: Secondary | ICD-10-CM

## 2019-08-07 DIAGNOSIS — N939 Abnormal uterine and vaginal bleeding, unspecified: Secondary | ICD-10-CM

## 2019-08-07 DIAGNOSIS — R310 Gross hematuria: Secondary | ICD-10-CM

## 2019-08-07 HISTORY — PX: IVC FILTER INSERTION: CATH118245

## 2019-08-07 LAB — BASIC METABOLIC PANEL
Anion gap: 9 (ref 5–15)
BUN: 71 mg/dL — ABNORMAL HIGH (ref 8–23)
CO2: 22 mmol/L (ref 22–32)
Calcium: 8 mg/dL — ABNORMAL LOW (ref 8.9–10.3)
Chloride: 105 mmol/L (ref 98–111)
Creatinine, Ser: 2.36 mg/dL — ABNORMAL HIGH (ref 0.44–1.00)
GFR calc Af Amer: 23 mL/min — ABNORMAL LOW (ref >60)
GFR calc non Af Amer: 20 mL/min — ABNORMAL LOW (ref >60)
Glucose, Bld: 99 mg/dL (ref 70–99)
Potassium: 2.9 mmol/L — ABNORMAL LOW (ref 3.5–5.1)
Sodium: 136 mmol/L (ref 135–145)

## 2019-08-07 LAB — CBC
HCT: 27.7 % — ABNORMAL LOW (ref 36.0–46.0)
Hemoglobin: 9 g/dL — ABNORMAL LOW (ref 12.0–15.0)
MCH: 28.4 pg (ref 26.0–34.0)
MCHC: 32.5 g/dL (ref 30.0–36.0)
MCV: 87.4 fL (ref 80.0–100.0)
Platelets: 454 10*3/uL — ABNORMAL HIGH (ref 150–400)
RBC: 3.17 MIL/uL — ABNORMAL LOW (ref 3.87–5.11)
RDW: 15.3 % (ref 11.5–15.5)
WBC: 13.4 10*3/uL — ABNORMAL HIGH (ref 4.0–10.5)
nRBC: 0 % (ref 0.0–0.2)

## 2019-08-07 LAB — GLUCOSE, CAPILLARY
Glucose-Capillary: 53 mg/dL — ABNORMAL LOW (ref 70–99)
Glucose-Capillary: 156 mg/dL — ABNORMAL HIGH (ref 70–99)
Glucose-Capillary: 120 mg/dL — ABNORMAL HIGH (ref 70–99)
Glucose-Capillary: 125 mg/dL — ABNORMAL HIGH (ref 70–99)
Glucose-Capillary: 258 mg/dL — ABNORMAL HIGH (ref 70–99)

## 2019-08-07 LAB — CEA: CEA: 8.9 ng/mL — ABNORMAL HIGH (ref 0.0–4.7)

## 2019-08-07 SURGERY — IVC FILTER INSERTION
Anesthesia: Moderate Sedation

## 2019-08-07 MED ORDER — SODIUM CHLORIDE 0.9% FLUSH: 10.0000 mL | INTRAVENOUS | Status: DC | PRN

## 2019-08-07 MED ORDER — FENTANYL CITRATE (PF) 100 MCG/2ML IJ SOLN
INTRAMUSCULAR | Status: AC
  Filled 2019-08-07: qty 2

## 2019-08-07 MED ORDER — ONDANSETRON HCL 4 MG/2ML IJ SOLN: 4.0000 mg | Freq: Four times a day (QID) | INTRAMUSCULAR | Status: DC | PRN

## 2019-08-07 MED ORDER — MIDAZOLAM HCL 5 MG/5ML IJ SOLN
INTRAMUSCULAR | Status: AC
  Filled 2019-08-07: qty 5

## 2019-08-07 MED ORDER — POTASSIUM CHLORIDE 10 MEQ/100ML IV SOLN
10.0000 meq | INTRAVENOUS | Status: AC
  Administered 2019-08-07: 10 meq via INTRAVENOUS

## 2019-08-07 MED ORDER — FAMOTIDINE 20 MG PO TABS: 40.0000 mg | ORAL_TABLET | Freq: Once | ORAL | Status: DC | PRN

## 2019-08-07 MED ORDER — SODIUM CHLORIDE 0.9% FLUSH
10.0000 mL | Freq: Two times a day (BID) | INTRAVENOUS | Status: DC
  Administered 2019-08-07 – 2019-08-08 (×2): 10 mL
  Administered 2019-08-08: 22:00:00 30 mL
  Administered 2019-08-09 (×2): 10 mL
  Administered 2019-08-10: 22:00:00 40 mL
  Administered 2019-08-10: 09:00:00 10 mL
  Administered 2019-08-11: 10:00:00 20 mL

## 2019-08-07 MED ORDER — SODIUM CHLORIDE 0.9 % IV SOLN: INTRAVENOUS | Status: DC

## 2019-08-07 MED ORDER — MAGNESIUM SULFATE 2 GM/50ML IV SOLN
2.0000 g | Freq: Once | INTRAVENOUS | Status: AC
  Administered 2019-08-07: 13:00:00 2 g via INTRAVENOUS
  Filled 2019-08-07: qty 50

## 2019-08-07 MED ORDER — CEFAZOLIN SODIUM-DEXTROSE 1-4 GM/50ML-% IV SOLN
1.0000 g | INTRAVENOUS | Status: DC
  Filled 2019-08-07: qty 50

## 2019-08-07 MED ORDER — POTASSIUM CHLORIDE 10 MEQ/100ML IV SOLN
10.0000 meq | INTRAVENOUS | Status: AC
  Administered 2019-08-07: 12:00:00 10 meq via INTRAVENOUS
  Filled 2019-08-07: qty 100

## 2019-08-07 MED ORDER — MIDAZOLAM HCL 2 MG/ML PO SYRP
8.0000 mg | ORAL_SOLUTION | Freq: Once | ORAL | Status: DC | PRN
  Filled 2019-08-07: qty 4

## 2019-08-07 MED ORDER — MIDAZOLAM HCL 2 MG/2ML IJ SOLN
INTRAMUSCULAR | Status: DC | PRN
  Administered 2019-08-07: 2 mg via INTRAVENOUS

## 2019-08-07 MED ORDER — DEXTROSE-NACL 5-0.9 % IV SOLN: INTRAVENOUS | Status: DC

## 2019-08-07 MED ORDER — FENTANYL CITRATE (PF) 100 MCG/2ML IJ SOLN
INTRAMUSCULAR | Status: DC | PRN
  Administered 2019-08-07: 50 ug via INTRAVENOUS

## 2019-08-07 MED ORDER — CEFAZOLIN SODIUM-DEXTROSE 1-4 GM/50ML-% IV SOLN
INTRAVENOUS | Status: AC
  Administered 2019-08-07: 1 g
  Filled 2019-08-07: qty 50

## 2019-08-07 MED ORDER — DIPHENHYDRAMINE HCL 50 MG/ML IJ SOLN: 50.0000 mg | Freq: Once | INTRAMUSCULAR | Status: DC | PRN

## 2019-08-07 MED ORDER — METHYLPREDNISOLONE SODIUM SUCC 125 MG IJ SOLR: 125.0000 mg | Freq: Once | INTRAMUSCULAR | Status: DC | PRN

## 2019-08-07 MED ORDER — IODIXANOL 320 MG/ML IV SOLN
INTRAVENOUS | Status: DC | PRN
  Administered 2019-08-07: 20 mL

## 2019-08-07 MED ORDER — POTASSIUM CHLORIDE CRYS ER 20 MEQ PO TBCR
40.0000 meq | EXTENDED_RELEASE_TABLET | Freq: Once | ORAL | Status: AC
  Administered 2019-08-07: 40 meq via ORAL
  Filled 2019-08-07: qty 2

## 2019-08-07 MED ORDER — HYDROMORPHONE HCL 1 MG/ML IJ SOLN: 1.0000 mg | Freq: Once | INTRAMUSCULAR | Status: DC | PRN

## 2019-08-07 MED ORDER — CHLORHEXIDINE GLUCONATE CLOTH 2 % EX PADS
6.0000 | MEDICATED_PAD | Freq: Every day | CUTANEOUS | Status: DC
  Administered 2019-08-07 – 2019-08-11 (×5): 6 via TOPICAL

## 2019-08-07 MED ORDER — DEXTROSE 50 % IV SOLN
25.0000 g | Freq: Once | INTRAVENOUS | Status: AC
  Administered 2019-08-07: 12:00:00 25 g via INTRAVENOUS
  Filled 2019-08-07: qty 50

## 2019-08-07 SURGICAL SUPPLY — 7 items
CANNULA 5F STIFF (CANNULA) ×2 IMPLANT
COVER PROBE U/S 5X48 (MISCELLANEOUS) ×2 IMPLANT
FILTER CELECT-JUGULAR (Filter) ×2 IMPLANT
KIT CATH CVC 3 LUMEN 7FR 8IN (MISCELLANEOUS) ×2 IMPLANT
KIT FEMORAL DEL DENALI (Miscellaneous) IMPLANT
PACK ANGIOGRAPHY (CUSTOM PROCEDURE TRAY) ×2 IMPLANT
WIRE J 3MM .035X145CM (WIRE) ×2 IMPLANT

## 2019-08-07 NOTE — Care Management Important Message (Signed)
Important Message  Patient Details  Name: Anne George MRN: [id number] Date of Birth: 28-Mar-1946   Medicare Important Message Given:  Yes     [name] 08/07/2019, 1:42 PM

## 2019-08-07 NOTE — Progress Notes (Signed)
Inpatient Diabetes Program Recommendations  AACE/ADA: New Consensus Statement on Inpatient Glycemic Control   Target Ranges:  Prepandial:   less than 140 mg/dL      Peak postprandial:   less than 180 mg/dL (1-2 hours)      Critically ill patients:  140 - 180 mg/dL   Results for KOURTNY, GUZZETTA (MRN [id number]) as of 08/07/2019 12:14  Ref. Range 08/06/2019 08:07 08/06/2019 11:57 08/06/2019 17:09 08/06/2019 21:48 08/07/2019 12:04  Glucose-Capillary Latest Ref Range: 70 - 99 mg/dL 129 (H) 151 (H) 98 121 (H) 53 (L)   Review of Glycemic Control  Diabetes history: DM1 Outpatient Diabetes medications: Lantus 32 units QHS, Novolog 3-5 times per day (2 units for every 15 grams of carbs) Current orders for Inpatient glycemic control: Lantus 32 units QHS, Novolog 0-6 units TID with meals, Novolog 0-5 units QHS, Novolog 4 units TID with meals for meal coverage  Inpatient Diabetes Program Recommendations:    Insulin-Basal: Please consider decreasing Lantus to 30 units QHS.  Thanks, Barnie Alderman, RN, MSN, CDE Diabetes Coordinator Inpatient Diabetes Program [phone] (Team Pager from 8am to 5pm)

## 2019-08-07 NOTE — Progress Notes (Signed)
Patient ID: Anne George, female   DOB: 13-Feb-1946, 74 y.o.   MRN: [id number] [facility] PROGRESS NOTE  DORAN DEC [id number] DOB: 1945-09-28 DOA: 08/04/2019 PCP: Joseph Pierini, MD  HPI/Subjective: Patient feeling weak.  Patient still having any blood in the container collecting urine.  Has some pain in her legs when I press.  No shortness of breath.  Objective: Vitals:   08/06/19 2312 08/07/19 0754  BP: (!) 114/45 (!) 132/45  Pulse: 68 75  Resp: 16 17  Temp: 98.4 F (36.9 C) 98 F (36.7 C)  SpO2: 100% 98%    Intake/Output Summary (Last 24 hours) at 08/07/2019 1357 Last data filed at 08/07/2019 1111 Gross per 24 hour  Intake 2378.93 ml  Output 550 ml  Net 1828.93 ml   Filed Weights   08/04/19 1600  Weight: 72.6 kg    ROS: Review of Systems  Constitutional: Negative for chills and fever.  Eyes: Negative for blurred vision.  Respiratory: Negative for cough and shortness of breath.   Cardiovascular: Negative for chest pain.  Gastrointestinal: Negative for abdominal pain, constipation, diarrhea, nausea and vomiting.  Genitourinary: Negative for dysuria.  Musculoskeletal: Positive for joint pain.  Neurological: Negative for dizziness and headaches.   Exam: Physical Exam  Constitutional: She is oriented to person, place, and time.  HENT:  Nose: No mucosal edema.  Mouth/Throat: No oropharyngeal exudate or posterior oropharyngeal edema.  Eyes: Pupils are equal, round, and reactive to light. Conjunctivae, EOM and lids are normal.  Neck: Carotid bruit is not present.  Cardiovascular: S1 normal and S2 normal. Exam reveals no gallop.  No murmur heard. Respiratory: No respiratory distress. She has no wheezes. She has no rhonchi. She has no rales.  GI: Soft. Bowel sounds are normal. There is no abdominal tenderness.  Musculoskeletal:     Right ankle: Swelling present.     Left ankle: Swelling present.  Lymphadenopathy:    She has no cervical adenopathy.   Neurological: She is alert and oriented to person, place, and time. No cranial nerve deficit.  Skin: Skin is warm. Nails show no clubbing.  Chronic lower extremity discoloration. Stage III decubitus sacrum Right heel decubiti with necrotic material on it Left thigh healing blister  Psychiatric: She has a normal mood and affect.            Data Reviewed: Basic Metabolic Panel: Recent Labs  Lab 08/04/19 1613 08/05/19 0504 08/05/19 1449 08/06/19 0401 08/07/19 0346  NA 130* 134* 133* 137 136  K 4.1 3.9 2.8* 3.8 2.9*  CL 106 109 109 111 105  CO2 12* 11* 12* 15* 22  GLUCOSE 398* 435* 192* 166* 99  BUN 89* 93* 87* 77* 71*  CREATININE 3.10* 2.75* 2.78* 2.66* 2.36*  CALCIUM 9.1 8.9 8.8* 9.0 8.0*  MG  --   --  1.2*  --   --   PHOS  --   --   --  2.5  --    Liver Function Tests: Recent Labs  Lab 08/04/19 1613 08/05/19 0504 08/06/19 0401  AST 93* 47*  --   ALT 90* 67*  --   ALKPHOS 183* 159*  --   BILITOT 0.6 0.5  --   PROT 7.7 7.1  --   ALBUMIN 2.2* 2.0* 2.0*   CBC: Recent Labs  Lab 08/04/19 1613 08/05/19 0504 08/06/19 0401 08/07/19 0346  WBC 19.9* 18.0* 18.1* 13.4*  NEUTROABS 13.2*  --   --   --   HGB 11.2*  9.7* 10.5* 9.0*  HCT 35.8* 31.6* 34.2* 27.7*  MCV 90.4 91.3 91.2 87.4  PLT 478* 439* 525* 454*    CBG: Recent Labs  Lab 08/06/19 1157 08/06/19 1709 08/06/19 2148 08/07/19 1204 08/07/19 1247  GLUCAP 151* 98 121* 53* 156*    Recent Results (from the past 240 hour(s))  Urine culture     Status: Abnormal (Preliminary result)   Collection Time: 08/04/19  9:34 PM   Specimen: Urine, Random  Result Value Ref Range Status   Specimen Description   Final    URINE, RANDOM Performed at [facility], [address], [place], [place] [address]    Special Requests   Final    NONE Performed at [facility], [address], [place], [place] [address]    Culture (A)  Final    >=100,000 COLONIES/mL MORGANELLA  MORGANII SUSCEPTIBILITIES TO FOLLOW repeating Performed at [facility] [address] [place], [place] [address]    Report Status PENDING  Incomplete  C difficile quick scan w PCR reflex     Status: None   Collection Time: 08/06/19 10:10 AM   Specimen: STOOL  Result Value Ref Range Status   C Diff antigen NEGATIVE NEGATIVE Final   C Diff toxin NEGATIVE NEGATIVE Final   C Diff interpretation No C. difficile detected.  Final    Comment: Performed at [facility], [address] [place], [place] [address]     Studies: DG Foot 2 Views Right  Result Date: 08/06/2019 CLINICAL DATA:  74 year old female with decubitus ulcer right heel. EXAM: RIGHT FOOT - 2 VIEW COMPARISON:  None. FINDINGS: Calcified peripheral vascular disease. Soft tissue swelling in the region of the calcaneus but no soft tissue gas identified. The calcaneus appears intact no cortical osteolysis. Elsewhere bone mineralization in the right foot is also within normal limits. No acute osseous abnormality identified. IMPRESSION: No plain radiographic evidence of osteomyelitis. Right heel soft tissue swelling, calcified peripheral vascular disease. Electronically Signed   By: Genevie Ann M.D.   On: 08/06/2019 19:25   CT RENAL STONE STUDY  Result Date: 08/07/2019 CLINICAL DATA:  74 year old presenting with hematuria. Personal history of urinary tract calculi. Current history of stage 4 chronic kidney disease. EXAM: CT ABDOMEN AND PELVIS WITHOUT CONTRAST TECHNIQUE: Multidetector CT imaging of the abdomen and pelvis was performed following the standard protocol without IV contrast. COMPARISON:  None. FINDINGS: Lower chest: Minimal linear scar or atelectasis in the visualized lower lobes. Visualized lung bases otherwise clear. Heart size upper normal. LEFT circumflex coronary artery atherosclerosis. Hepatobiliary: Calcified granuloma in the anterior segment RIGHT lobe of liver. Otherwise normal unenhanced appearance of the  liver. Multiple gallstones within the gallbladder, the largest on the order of 6 mm in size. No pericholecystic edema or inflammation. No biliary ductal dilation. Pancreas: Mildly atrophic without evidence of mass or peripancreatic inflammation. Spleen: Normal unenhanced appearance. Two accessory splenules medial to the spleen just below the hilum. Adrenals/Urinary Tract: Mild BILATERAL adrenal hyperplasia without discrete nodularity. Moderate RIGHT hydronephrosis, though the dilated ureter can be followed to the bladder without a visible obstructing stone or mass. Layering hyperdense material in the RIGHT renal pelvis. No LEFT ureteral calculi. Extrarenal pelvis involving the LEFT kidney and/or mild hydronephrosis. Massively distended urinary bladder which likely accounts for the hydronephrosis. Mildly thickened bladder wall diffusely without a visible mass. Stomach/Bowel: Normal-appearing decompressed stomach. Normal-appearing small bowel. Apparent prior low AP resection as there is anastomotic suture material low in the pelvis.  Ostomy in the RIGHT mid abdomen with a large parastomal hernia, the defect measuring approximately 7 cm, with colon and small bowel in the hernia sac. Entire remaining colon decompressed. Vascular/Lymphatic: Severe aortoiliofemoral and visceral artery atherosclerosis without evidence of aneurysm. Stent in the LEFT common iliac vein. No pathologic lymphadenopathy. Reproductive: Surgically absent uterus and ovaries. Other: Post surgical/post treatment changes accounting for presacral soft tissue density. Decubitus ulcer overlying the lower sacrum. Musculoskeletal: Sarcopenia. Osseous demineralization. Degenerative disc disease and spondylosis at every lumbar level. BILATERAL L5 pars defects with grade 1 spondylolisthesis of L5 on S1 measuring approximately 7 mm. Likely old sacral insufficiency fracture. No evidence of osteomyelitis involving the sacrum underlying the decubitus ulcer.  IMPRESSION: 1. Massively distended urinary bladder which likely accounts for the moderate RIGHT hydronephrosis and the possible mild LEFT hydronephrosis. No obstructing urinary tract calculi. 2. Layering hyperdense material in the RIGHT renal pelvis, possibly hemorrhage or tiny calculi. 3. Decubitus ulcer overlying the lower sacrum without evidence of osteomyelitis involving the sacrum. 4. Cholelithiasis without evidence of acute cholecystitis. 5. 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 No evidence of bowel obstruction. 6. BILATERAL L5 pars defects with grade 1 spondylolisthesis of L5 on S1. Aortic Atherosclerosis (ICD10-I70.0). Electronically Signed   By: Evangeline Dakin M.D.   On: 08/07/2019 12:07    Scheduled Meds: . amLODipine  2.5 mg Oral Daily  . ascorbic acid  500 mg Oral BID  . atorvastatin  80 mg Oral Daily  . Chlorhexidine Gluconate Cloth  6 each Topical Daily  . cholecalciferol  5,000 Units Oral Daily  . collagenase  1 application Topical BID  . feeding supplement (PRO-STAT SUGAR FREE 64)  30 mL Oral TID WC  . gabapentin  300 mg Oral TID  . insulin aspart  0-5 Units Subcutaneous QHS  . insulin aspart  0-6 Units Subcutaneous TID WC  . metoprolol succinate  25 mg Oral Daily  . multivitamin with minerals  1 tablet Oral Daily  . pantoprazole  40 mg Oral Daily  . saccharomyces boulardii  250 mg Oral BID  . sodium bicarbonate  650 mg Oral BID  . venlafaxine XR  75 mg Oral Daily   Continuous Infusions: . [START ON 08/08/2019]  ceFAZolin (ANCEF) IV    . cefTRIAXone (ROCEPHIN)  IV Stopped (08/06/19 2331)  . dextrose 5 % and 0.9% NaCl 40 mL/hr at 08/07/19 1351  . potassium chloride 10 mEq (08/07/19 1350)    Assessment/Plan:  1. Hematuria.  CT scan renal stone protocol shows possible stone or blood in the right renal pelvis.  Severely dilated bladder and right hydronephrosis.  Foley catheter ordered.  Case discussed with  urology.  Serial hemoglobin.  Holding blood thinner at this time. 2. Left lower extremity DVT status post procedure last hospitalization by vascular surgery.  IVC filter to be placed today. 3. Stage III sacral decubiti, right heel decubiti with necrotic material, left thigh blister.  All present on admission.  Please see pictures.  Wound care consultation.  Podiatry consultation for heel ulcer. 4. Acute cystitis with hematuria.  Leukocytosis.  Morganella growing out a urine culture on Rocephin.  Sensitivities still pending. 5. Essential hypertension on metoprolol and Norvasc 6. 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  Put on sliding scale insulin.  Patient amp of D50 and have on D5 NS drip. 7. Recent Covid infection 8. Diarrhea.  Stool negative for C. difficile  Code Status:     Code Status Orders  (From admission, onward)         Start     Ordered   08/05/19 0254  Full code  Continuous     08/05/19 0254        Code Status History    Date Active Date Inactive Code Status Order ID Comments User Context   07/14/2019 1735 07/22/2019 2203 Full Code [id number]  Louellen Molder, MD ED   Advance Care Planning Activity     Family Communication: Spoke with son on the phone Disposition Plan: Likely will end up needing rehab.  IVC filter to be placed today.  Foley catheter to be placed today.  Await urology evaluation to see if they want to do anything while the patient is here or as outpatient.  Would like to see bleeding settled down prior to disposition.  Consultants:  Gynecology  Vascular surgery  Podiatry  Urology  Antibiotics:  Rocephin  Time spent: 27 minutes  [place]

## 2019-08-07 NOTE — Plan of Care (Signed)

## 2019-08-07 NOTE — Progress Notes (Signed)
Hypoglycemic Event  CBG: 53  Treatment:amp of D50 IV  Symptoms: asymptomatic  Follow-up CBG: Time:  1247  CBG Result: 156  Possible Reasons for Event: NPO for scheduled IVC filter insertion  Comments/MD notified:[place]iet[place] MD    Rande Roylance Silvestre Moment

## 2019-08-07 NOTE — Op Note (Signed)
[facility]   OPERATIVE NOTE    PRE-OPERATIVE DIAGNOSIS: DVT with vaginal bleeding requiring cessation of anticoagulation, anemia, poor venous access  POST-OPERATIVE DIAGNOSIS: same as above  PROCEDURE: 1.   Ultrasound guidance for vascular access to the right jugular vein 2.   Catheter placement into the inferior vena cava 3.   Inferior venacavogram 4.   Placement of a [place] select IVC filter 5.   Placement of right jugular triple-lumen catheter with fluoroscopic guidance and ultrasound for venous access  SURGEON: Leotis Pain, MD  ASSISTANT(S): None  ANESTHESIA: local with Moderate Conscious Sedation for approximately 20 minutes using 2 mg of Versed and 50 mcg of Fentanyl  ESTIMATED BLOOD LOSS: minimal  CONTRAST: 10 cc  FLUORO TIME: 1.5 minutes  FINDING(S): 1.  Patent IVC  SPECIMEN(S):  none  INDICATIONS:   Anne George is a 74 y.o. female who presents with vaginal bleeding after recent treatment for DVT and ongoing anticoagulation.  Her anticoagulation needs to be stopped.  Inferior vena cava filter is indicated for this reason.  She also has extremely poor venous access and we are asked to leave a central line we placed the IVC filter.  Risks and benefits including filter thrombosis, migration, fracture, bleeding, and infection were all discussed.  We discussed that all IVC filters that we place can be removed if desired from the patient once the need for the filter has passed.    DESCRIPTION: After obtaining full informed written consent, the patient was brought back to the vascular suite. The skin was sterilely prepped and draped in a sterile surgical field was created. Moderate conscious sedation was administered during a face to face encounter with the patient throughout the procedure with my supervision of the RN administering medicines and monitoring the patient's vital signs, pulse oximetry, telemetry and mental status throughout from the  start of the procedure until the patient was taken to the recovery room. The right jugular vein was accessed under direct ultrasound guidance without difficulty with a Seldinger needle and a J-wire was then placed. After skin nick and dilatation, the delivery sheath was placed into the inferior vena cava and an inferior venacavogram was performed. This demonstrated a patent IVC with the level of the renal veins at L1.  The filter was then deployed into the inferior vena cava at the level of L2 just below the renal veins. The delivery sheath was then removed over the J-wire.  Then, using fluoroscopic guidance a triple-lumen catheter was placed over the J-wire and the J-wire was removed.  It was secured at the skin at 16 to 17 cm with the catheter tip parked at the cavoatrial junction on fluoroscopy.  It was secured to the skin with 2 silk sutures. Sterile dressings were placed. The patient tolerated the procedure well and was taken to the recovery room in stable condition.  COMPLICATIONS: None  CONDITION: Stable  Leotis Pain  08/07/2019, 4:08 PM   This note was created with Dragon Medical transcription system. Any errors in dictation are purely unintentional.

## 2019-08-07 NOTE — Progress Notes (Signed)
OT Cancellation Note  Patient Details Name: Anne George MRN: [id number] DOB: 17-Jan-1946   Cancelled Treatment:    Reason Eval/Treat Not Completed: Patient at procedure or test/ unavailable   Pt off floor at this time in Vascular Sx to have IVC filter placed. Will f/u as able/appropriate for OT treatment. Thank you.  Gerrianne Scale, [place], OTR/L ascom [phone] 08/07/19, 4:35 PM

## 2019-08-07 NOTE — Consult Note (Addendum)
Urology Consult   I have been asked to see the patient by Dr. Leslye Peer, for evaluation and management of urinary retention, UTI, hematuria.  Chief Complaint: Urinary retention, UTI, hematuria  HPI:  Anne George is a 74 y.o. 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  She was thought to have vaginal bleeding and her Eliquis was stopped and she was evaluated by vascular surgery for possible IVC filter, and IVC filter was placed 08/07/2019.  She was evaluated by gynecology, and bleeding was thought to be coming from the urethra and not the vagina.  CT abdomen pelvis without contrast performed 08/07/2019 showed massively distended bladder with bilateral upstream hydroureteronephrosis.  Urine culture ultimately grew Morganella morganii resistant to ampicillin, cefazolin, nitrofurantoin.  A Foley catheter was placed last night for her urinary retention, and renal function is improved this morning.  Aside from some mild abdominal discomfort, she denies any complaints today.  She denies any hematuria prior to this hospitalization.  She denies any urinary symptoms of feeling of incomplete emptying, weak stream, or incontinence.  She had a hematuria work-up with Dr. Jacqlyn Larsen at [facility] in 2018 that showed a normal bladder on cystoscopy, and CT urogram was benign.   PMH: Obesity Rectal cancer CKD stage IV Chronic lymphedema Recurrent UTI Diabetes  Surgical History: Rectal resection and colostomy Multiple vascular procedures  Allergies: No Known Allergies  Family History: History reviewed. No pertinent family history.  Social History:  reports that  she has never smoked. She has never used smokeless tobacco. She reports that she does not drink alcohol or use drugs.  ROS: 10 point review of systems negative aside from those stated in the HPI  Physical Exam: BP (!) 132/45 (BP Location: Left Arm)   Pulse 75   Temp 98 F (36.7 C) (Oral)   Resp 17   Ht 4\' 10"  (1.473 m)   Wt 72.6 kg   SpO2 98%   BMI 33.44 kg/m    Constitutional:  Alert and oriented, No acute distress. Cardiovascular: No clubbing, cyanosis, or edema. Respiratory: Normal respiratory effort, no increased work of breathing. GI: Abdomen is soft, nontender, nondistended, no abdominal masses, colostomy in place with semisolid stool GU: 20 French two-way Foley with purulent urine, amber-colored  Laboratory Data: Reviewed, see HPI  Pertinent Imaging: I have personally reviewed the CT abdomen pelvis without contrast.  Massively distended bladder with bilateral hydroureteronephrosis.  No nephrolithiasis.  Assessment & Plan:   In summary, she is a co-morbid 74 year old female currently admitted with hematuria likely secondary to Morganella morganii UTI.  Causation of her UTI could be incomplete bladder emptying secondary to an atonic bladder from diabetes versus UTI causing weakness and incomplete bladder emptying.  Her hematuria has completely resolved with stopping Eliquis, and culture appropriate antibiotics for her UTI.  She did have a negative hematuria work-up with Dr. Jacqlyn Larsen with cystoscopy and CT urogram in 2018.  [name] long conversation about her multiple medical issues, and the need for follow-up in 2 weeks for a voiding trial and bladder scan.  If she is emptying well at that time we will need to closely monitor her PVRs and follow-up to make sure she continues to empty well.  However, she  is unable to empty with significant PVR, will need to discuss intermittent catheterization versus chronic Foley for atonic bladder.  Does not warrant further hematuria work-up at this  time in the setting of her acute UTI likely being the causation for her gross hematuria.  Recommendations: Maintain Foley, will arrange urology follow-up in 2 weeks for voiding trial Recommend 10-14 days culture appropriate antibiotics for complicated UTI Okay to resume anticoagulation tomorrow from a urology perspective after she has gotten 24 hours of culture appropriate antibiotics  Billey Co, MD  Total time spent on the floor was 110 minutes, with greater than 50% spent in counseling and coordination of care with the patient regarding hematuria, UTI, and urinary retention.  [facility] [address] [place], [place] [address] [phone]

## 2019-08-07 NOTE — H&P (Signed)
[facility] History & Physical Update  The patient was interviewed and re-examined.  The patient's previous History and Physical has been reviewed and is unchanged.  There is no change in the plan of care. We plan to proceed with the scheduled procedure.  Leotis Pain, MD  08/07/2019, 3:36 PM

## 2019-08-08 ENCOUNTER — Encounter: Payer: Self-pay | Admitting: Cardiology

## 2019-08-08 ENCOUNTER — Telehealth: Payer: Self-pay | Admitting: Urology

## 2019-08-08 DIAGNOSIS — N179 Acute kidney failure, unspecified: Secondary | ICD-10-CM

## 2019-08-08 DIAGNOSIS — L97419 Non-pressure chronic ulcer of right heel and midfoot with unspecified severity: Secondary | ICD-10-CM

## 2019-08-08 DIAGNOSIS — D62 Acute posthemorrhagic anemia: Secondary | ICD-10-CM

## 2019-08-08 DIAGNOSIS — R338 Other retention of urine: Secondary | ICD-10-CM

## 2019-08-08 LAB — BASIC METABOLIC PANEL
Anion gap: 8 (ref 5–15)
BUN: 65 mg/dL — ABNORMAL HIGH (ref 8–23)
CO2: 20 mmol/L — ABNORMAL LOW (ref 22–32)
Calcium: 7.9 mg/dL — ABNORMAL LOW (ref 8.9–10.3)
Chloride: 107 mmol/L (ref 98–111)
Creatinine, Ser: 1.97 mg/dL — ABNORMAL HIGH (ref 0.44–1.00)
GFR calc Af Amer: 29 mL/min — ABNORMAL LOW (ref >60)
GFR calc non Af Amer: 25 mL/min — ABNORMAL LOW (ref >60)
Glucose, Bld: 184 mg/dL — ABNORMAL HIGH (ref 70–99)
Potassium: 4 mmol/L (ref 3.5–5.1)
Sodium: 135 mmol/L (ref 135–145)

## 2019-08-08 LAB — GLUCOSE, CAPILLARY
Glucose-Capillary: 201 mg/dL — ABNORMAL HIGH (ref 70–99)
Glucose-Capillary: 330 mg/dL — ABNORMAL HIGH (ref 70–99)
Glucose-Capillary: 369 mg/dL — ABNORMAL HIGH (ref 70–99)
Glucose-Capillary: 424 mg/dL — ABNORMAL HIGH (ref 70–99)

## 2019-08-08 LAB — URINE CULTURE: Culture: >=100000 — AB

## 2019-08-08 LAB — CBC
HCT: 26.5 % — ABNORMAL LOW (ref 36.0–46.0)
Hemoglobin: 8.5 g/dL — ABNORMAL LOW (ref 12.0–15.0)
MCH: 28.4 pg (ref 26.0–34.0)
MCHC: 32.1 g/dL (ref 30.0–36.0)
MCV: 88.6 fL (ref 80.0–100.0)
Platelets: 488 10*3/uL — ABNORMAL HIGH (ref 150–400)
RBC: 2.99 MIL/uL — ABNORMAL LOW (ref 3.87–5.11)
RDW: 15.8 % — ABNORMAL HIGH (ref 11.5–15.5)
WBC: 18.6 10*3/uL — ABNORMAL HIGH (ref 4.0–10.5)
nRBC: 0 % (ref 0.0–0.2)

## 2019-08-08 LAB — GLUCOSE, RANDOM: Glucose, Bld: 468 mg/dL — ABNORMAL HIGH (ref 70–99)

## 2019-08-08 MED ORDER — INSULIN GLARGINE 100 UNIT/ML ~~LOC~~ SOLN
20.0000 [IU] | Freq: Every day | SUBCUTANEOUS | Status: DC
  Administered 2019-08-08: 22:00:00 20 [IU] via SUBCUTANEOUS
  Filled 2019-08-08 (×2): qty 0.2

## 2019-08-08 MED ORDER — SODIUM CHLORIDE 0.9 % IV SOLN
300.0000 mg | Freq: Once | INTRAVENOUS | Status: AC
  Administered 2019-08-08: 15:00:00 300 mg via INTRAVENOUS
  Filled 2019-08-08: qty 15

## 2019-08-08 MED ORDER — CIPROFLOXACIN IN D5W 400 MG/200ML IV SOLN
400.0000 mg | INTRAVENOUS | Status: DC
  Administered 2019-08-08 – 2019-08-10 (×3): 400 mg via INTRAVENOUS
  Filled 2019-08-08 (×4): qty 200

## 2019-08-08 MED ORDER — INSULIN ASPART 100 UNIT/ML ~~LOC~~ SOLN
10.0000 [IU] | Freq: Once | SUBCUTANEOUS | Status: AC
  Administered 2019-08-08: 22:00:00 10 [IU] via SUBCUTANEOUS
  Filled 2019-08-08: qty 1

## 2019-08-08 NOTE — Consult Note (Addendum)
WOC Nurse Consult Note: [place] Nurse ostomy consult note Stoma type/location: RLQ ileostomy Stomal assessment/size: 1 and 1/4 inch round, minimally budded, red, moist. Os at center. Peristomal assessment: Deep creases in abdominal plane, the deepest and most problematic of which is located at 5:00 Treatment options for stomal/peristomal skin: 1/2 of skin barrier ring rolled and placed into crease.  Pouching system is cut off-center to provide maximum skin barrier to skin ratio Output: seedy light yellow effluent (patient has just finished breakfast of scrambled eggs, grits, etc.  Ostomy pouching: 1pc convex pouch (1.5 inch), Lawson # [id number]. Skin barrier rings, Lawson # [phone]  Education provided: None. Patient is independent at home with pouch changes. Enrolled patient in [place] program: Yes, previously.  [facility] orders supplies from [employer] and is pleased with their service. She reports that she gets 40 pouches/month, but only 20 rings. She is provided with 5 pouches and 10 rings to the room (I ordered with the assistance of the unit secretary, Manuela Schwartz).   WOC Nurse Consult Note: Reason for Consult:Full thickness area of tissue loss at the posterior left knee Wound type:moisture, friction Pressure Injury POA: NA Measurement:1cm x 2.5cm x 0.2cm Wound [id number] Drainage (amount, consistency, odor) scant Periwound:intact, moist Dressing procedure/placement/frequency: I will implement a conservative POC using xeroform gauze as a wound contact layer topped with a silicone foam dressing with daily changes.  Due to the pressure injuries previously noted by my partner, I have ordered a mattress replacement. Patient is reluctant to turn and reposition due to likelihood of ostomy pouch leakage.   [place] nursing team will not follow, but will remain available to this patient, the nursing and medical teams.  Please re-consult if needed. Thanks, Maudie Flakes, MSN, RN, [place],  Arther Abbott  Pager# [phone]

## 2019-08-08 NOTE — Evaluation (Signed)
Physical Therapy ReEvaluation Patient Details Name: Anne George MRN: [id number] DOB: 12/03/1945 Today's Date: 08/08/2019   History of Present Illness  Anne George is a 53yoF who comes [facility] on 2/15 from [facility] ([facility]) d/t weakness and vaginal bleeding. Per [facility] Pt was seen by ourr services prior admission, went to [facility] d/t significant deconditioning. Prior to Jan, pt lives alone at home, AMB with [facility]. PMH: CAD, HTN, HLD, DM1, DVT on eliquis, anal CA s/p radiation s/p colostomy, uncomplicated [id number] infection Jan 2021. Pt underwent stent placement on 2/18. Also seen by wound care for sacral wound and Rt unstageable heel ulcer. Obstetrics following reports bleeding to be likely from urological source.  Clinical Impression  Pt admitted with above diagnosis. Pt currently with functional limitations due to the deficits listed below (see "PT Problem List"). Upon entry, pt in bed, awake and agreeable to participate. Pt reports feeling better since yesterday. The pt is alert and oriented x4, pleasant, conversational, and generally a good historian. Pt still fairly weak, tolerates bed level exercises for 20+ minutes, maxA to EOB (totalA back to supine), modA to rise to standing, but orthostatic BP is a limiting factor, tolerating less than 30sec. Functional mobility assessment demonstrates increased effort/time requirements, poor tolerance, and need for physical assistance, whereas the patient performed these at a higher level of independence PTA. Pt asking about sitting EOB later once energy is recovered, which author reports with support of RN. Pt will benefit from skilled PT intervention to increase independence and safety with basic mobility in preparation for discharge to the venue listed below.       Follow Up Recommendations SNF;Supervision for mobility/OOB;Supervision - Intermittent    Equipment Recommendations  None recommended by PT    Recommendations for Other Services        Precautions / Restrictions Precautions Precautions: Fall Precaution Comments: colostomy; float heels Required Braces or Orthoses: (prevalon boots)      Mobility  Bed Mobility Overal bed mobility: Needs Assistance       Supine to sit: Max assist Sit to supine: Total assist   General bed mobility comments: heavy effort, good technique use of rails, but ultimately very limited.  Transfers Overall transfer level: Needs assistance Equipment used: 1 person hand held assist Transfers: Sit to/from Stand Sit to Stand: Mod assist         General transfer comment: inititally difficulty locking knees, dizzy after 15 secs with BP drop; tolerates 2 additional STS transfers with brief standing interval.  Ambulation/Gait   Gait Distance (Feet): (deferred due to weakness and orthostatic hypotension/dizziness.)            Stairs            Wheelchair Mobility    Modified Rankin (Stroke Patients Only)       Balance Overall balance assessment: Needs assistance Sitting-balance support: Feet supported;Single extremity supported;No upper extremity supported Sitting balance-Leahy Scale: Good Sitting balance - Comments: tolerates sitting EOB for seevral minutes, good recovery of orthostatic dizziness after standing attempts.       Standing balance comment: unable to assess                             Pertinent Vitals/Pain Pain Assessment: Faces Faces Pain Scale: Hurts even more Pain Location: intermittent ABD cramping, worse with STS transfers Pain Descriptors / Indicators: Aching;Grimacing Pain Intervention(s): Limited activity within patient's tolerance;Monitored during session    Home Living Family/patient  expects to be discharged to:: Skilled nursing facility Living Arrangements: Other (Comment) Available Help at Discharge: Family;Available PRN/intermittently Type of Home: Mobile home Home Access: Stairs to enter Entrance Stairs-Rails:  Right;Left;Can reach both Entrance Stairs-Number of Steps: 4-5   Home Equipment: Cane - single point;Shower seat;Walker - 4 wheels;Bedside commode Additional Comments: Pt reports that she was primarily using [place] for fxl mobility and did not use BSC or shower chair at baseline.    Prior Function Level of Independence: Independent with assistive device(s)         Comments: States she was performing all ADLs/ADL mobility I'ly. Her Son and dtr in law helped with IADLs including transportation and heavy lifting tasks like laundry. However, since recent hospitalization and stay at [facility], endorses decline in mobility/tolerance.     Hand Dominance   Dominant Hand: Right    Extremity/Trunk Assessment   Upper Extremity Assessment Upper Extremity Assessment: Generalized weakness    Lower Extremity Assessment Lower Extremity Assessment: Generalized weakness       Communication   Communication: No difficulties  Cognition Arousal/Alertness: Awake/alert Behavior During Therapy: WFL for tasks assessed/performed Overall Cognitive Status: Within Functional Limits for tasks assessed                                 General Comments: pt A&O x4, very oriented to medications, timeline of hospitalizations, very good source of PLOF information.      General Comments      Exercises Total Joint Exercises Bridges: Strengthening;Both;Supine;Limitations;10 reps Bridges Limitations: unable to perform full lift-off; isometric hold only; author provoides Rt heel offset for pain relief. General Exercises - Lower Extremity Ankle Circles/Pumps: AROM;Strengthening;Both;Supine;15 reps Short Arc Quad: AROM;Both;15 reps;Supine;AAROM Heel Slides: Both;Supine;AAROM;15 reps Hip ABduction/ADduction: AAROM;Strengthening;Both;Supine;15 reps Straight Leg Raises: AAROM;Both;Supine;10 reps   Assessment/Plan    PT Assessment Patient needs continued PT services  PT Problem List Decreased  strength;Decreased activity tolerance;Decreased balance;Decreased mobility;Decreased knowledge of use of DME;Cardiopulmonary status limiting activity;Decreased knowledge of precautions       PT Treatment Interventions DME instruction;Gait training;Stair training;Functional mobility training;Therapeutic activities;Therapeutic exercise;Balance training;Patient/family education    PT Goals (Current goals can be found in the Care Plan section)  Acute Rehab PT Goals Patient Stated Goal: return to baseline level of independence PT Goal Formulation: With patient Time For Goal Achievement: 08/20/19 Potential to Achieve Goals: Fair    Frequency Min 2X/week   Barriers to discharge Decreased caregiver support      Co-evaluation               AM-PAC PT "6 Clicks" Mobility  Outcome Measure Help needed turning from your back to your side while in a flat bed without using bedrails?: Total Help needed moving from lying on your back to sitting on the side of a flat bed without using bedrails?: Total Help needed moving to and from a bed to a chair (including a wheelchair)?: Total Help needed standing up from a chair using your arms (e.g., wheelchair or bedside chair)?: Total Help needed to walk in hospital room?: Total Help needed climbing 3-5 steps with a railing? : Total 6 Click Score: 6    End of Session   Activity Tolerance: Patient tolerated treatment well;No increased pain Patient left: in bed;with family/visitor present;with nursing/sitter in room;with call bell/phone within reach Nurse Communication: Precautions PT Visit Diagnosis: Other abnormalities of gait and mobility (R26.89);Muscle weakness (generalized) (M62.81);Difficulty in walking, not elsewhere classified (R26.2)  Time: [id number] PT Time Calculation (min) (ACUTE ONLY): 52 min   Charges:   PT Evaluation $PT Re-evaluation: 1 Re-eval PT Treatments $Therapeutic Exercise: 23-37 mins        2:52 PM,  08/08/19 Etta Grandchild, PT, DPT Physical Therapist - [facility]  [phone] ([place])   [place] C 08/08/2019, 2:44 PM

## 2019-08-08 NOTE — Progress Notes (Signed)
Inpatient Diabetes Program Recommendations  AACE/ADA: New Consensus Statement on Inpatient Glycemic Control (2015)  Target Ranges:  Prepandial:   less than 140 mg/dL      [facility]ak postprandial:   less than 180 mg/dL (1-2 hours)      Critically ill patients:  140 - 180 mg/dL   Lab Results  Component Value Date   GLUCAP 201 (H) 08/08/2019   HGBA1C 8.4 (H) 07/14/2019    Review of Glycemic Control Results for Anne George, Anne George (MRN [id number]) as of 08/08/2019 11:00  Ref. Range 08/07/2019 16:21 08/07/2019 20:56 08/08/2019 08:53  Glucose-Capillary Latest Ref Range: 70 - 99 mg/dL 125 (H) 258 (H) 201 (H)  Diabetes history: DM1 Outpatient Diabetes medications: Lantus 32 units QHS, Novolog 3-5 times per day (2 units for every 15 grams of carbs) Current orders for Inpatient glycemic control: Novolog 0-6 units tid with meals and HS Inpatient Diabetes Program Recommendations:    Note lantus D/c'd due to low blood sugars. Fasting CBG=201 mg/dL- Consider restarting Lantus 20 units daily (patient was previously on Lantus 32 units).  Thanks,  Adah Perl, RN, BC-ADM Inpatient Diabetes Coordinator Pager [phone] (8a-5p)

## 2019-08-08 NOTE — TOC Progression Note (Signed)
Transition of Care [facility]) - Progression Note    Patient Details  Name: MAGDALENA SKILTON MRN: [id number] Date of Birth: 1945/08/18  Transition of [facility] CM/SW Contact  Leane Loring, Gardiner Rhyme, LCSW Phone Number: 08/08/2019, 4:16 PM  Clinical Narrative: Met with pt and spoke with son via telephone to discuss rehab facilities. Informed him [facility] only is taking COVID positive pts. He was ok with looking at [facility] or [facility] in [place]. Have sent pt's paperwork for them to consider pt. Discussed if pt has other insurance due to only has seven more full coverage days in SNF then will go into co-pay days. Son thinks she has Medicaid, will try to check on this. Will stay bin contact with son and pt regarding finding rehab bed for her. Then will need to get [facility] to approve. Continue to work on discharge plans.     Expected Discharge Plan: [place]    Expected Discharge Plan and Services Expected Discharge Plan: [place] arrangements for the past 2 months: [place]                                       Social Determinants of Health (SDOH) Interventions    Readmission Risk Interventions No flowsheet data found.

## 2019-08-08 NOTE — Telephone Encounter (Signed)
done

## 2019-08-08 NOTE — Consult Note (Signed)
Pharmacy Antibiotic Note  Anne George is a 74 y.o. female admitted on 08/04/2019 with UTI.  Pharmacy has been consulted for ciprofloxacin dosing.  Plan: Will dose ciprofloxacin 400mg  IV q 24h  Height: 4\' 10"  (147.3 cm) Weight: 160 lb 0.9 oz (72.6 kg) IBW/kg (Calculated) : 40.9  Temp (24hrs), Avg:98.2 F (36.8 C), Min:98 F (36.7 C), Max:98.4 F (36.9 C)  Recent Labs  Lab 08/04/19 1613 08/04/19 1613 08/05/19 0504 08/05/19 1449 08/05/19 2121 08/06/19 0401 08/07/19 0346 08/08/19 0500  WBC 19.9*  --  18.0*  --   --  18.1* 13.4* 18.6*  CREATININE 3.10*   < > 2.75* 2.78*  --  2.66* 2.36* 1.97*  LATICACIDVEN  --   --   --  2.0* 2.3*  --   --   --    < > = values in this interval not displayed.    Estimated Creatinine Clearance: 21.8 mL/min (A) (by C-G formula based on SCr of 1.97 mg/dL (H)).    No Known Allergies  Antimicrobials this admission: 2/16 Ceftriaxone >> 2/19 2/19 Cipro >>  Dose adjustments this admission: None  Microbiology results: 2/15 UCx: Morganella - Cefazolin resistant  2/17 C Diff NEG/NEG  Thank you for allowing pharmacy to be a part of this patient's care.  Lu Duffel, PharmD, BCPS Clinical Pharmacist 08/08/2019 10:41 AM

## 2019-08-08 NOTE — Telephone Encounter (Signed)
-----   Message from Billey Co, MD sent at 08/08/2019 10:52 AM EST ----- Regarding: follow up Please change her follow up to be with me in 2 weeks for a voiding trial early AM, then PM visit same day with PA for bladder scan, ok to Dorene Ar, MD 08/08/2019

## 2019-08-08 NOTE — Progress Notes (Signed)
[facility]  Daily Progress Note   Subjective: 1 Day Post-Op: 1.   Ultrasound guidance for vascular access to the right jugular vein 2.   Catheter placement into the inferior vena cava 3.   Inferior venacavogram 4.   Placement of a Cook select IVC filter 5.   Placement of right jugular triple-lumen catheter with fluoroscopic guidance and ultrasound for venous access  Patient without complaint this AM. No issues overnight.   Objective: Vitals:   08/07/19 1645 08/07/19 1702 08/07/19 2330 08/08/19 0846  BP: (!) 124/35 (!) 129/52 (!) 111/43 (!) 120/42  Pulse:  93 72 66  Resp: 16 17 17 16   Temp:  98 F (36.7 C) 98 F (36.7 C) 98.3 F (36.8 C)  TempSrc:  Oral  Oral  SpO2:  100% 98% 100%  Weight:      Height:        Intake/Output Summary (Last 24 hours) at 08/08/2019 1141 Last data filed at 08/08/2019 0532 Gross per 24 hour  Intake 189.71 ml  Output 2200 ml  Net -2010.29 ml   Physical Exam: A&Ox3, NAD Neck:  Access Site: Clean, dry and intact  Central Line: Clean, dry and intact CV: RRR Pulmonary: CTA Bilaterally Abdomen: Soft, Nontender, Nondistended Vascular: Warm, non-tender, minimal edema   Laboratory: CBC    Component Value Date/Time   WBC 18.6 (H) 08/08/2019 0500   HGB 8.5 (L) 08/08/2019 0500   HCT 26.5 (L) 08/08/2019 0500   PLT 488 (H) 08/08/2019 0500   BMET    Component Value Date/Time   NA 135 08/08/2019 0500   K 4.0 08/08/2019 0500   CL 107 08/08/2019 0500   CO2 20 (L) 08/08/2019 0500   GLUCOSE 184 (H) 08/08/2019 0500   BUN 65 (H) 08/08/2019 0500   CREATININE 1.97 (H) 08/08/2019 0500   CALCIUM 7.9 (L) 08/08/2019 0500   GFRNONAA 25 (L) 08/08/2019 0500   GFRAA 29 (L) 08/08/2019 0500   Assessment/Planning: The patient is a 74 year old female with a history of left lower extremity DVT on Eliquis with possible GU bleeding now status post IVC filter POD#1 1) s/p successful placement of IVC filter  Discussed with Dr.  [place] Parents Gaylyn Berish PA-C 08/08/2019 11:41 AM

## 2019-08-08 NOTE — Progress Notes (Signed)
Patient ID: Anne George, female   DOB: March 15, 1946, 74 y.o.   MRN: [id number] [facility] PROGRESS NOTE  Anne George [id number] DOB: [birth year] DOA: 08/04/2019 PCP: Joseph Pierini, MD  HPI/Subjective: Patient feeling much better today.  Still having some weakness.  Has some cramping occasionally in the abdomen.  Objective: Vitals:   08/07/19 2330 08/08/19 0846  BP: (!) 111/43 (!) 120/42  Pulse: 72 66  Resp: 17 16  Temp: 98 F (36.7 C) 98.3 F (36.8 C)  SpO2: 98% 100%    Intake/Output Summary (Last 24 hours) at 08/08/2019 1257 Last data filed at 08/08/2019 0532 Gross per 24 hour  Intake 189.71 ml  Output 2200 ml  Net -2010.29 ml   Filed Weights   08/04/19 1600 08/07/19 1421  Weight: 72.6 kg 72.6 kg    ROS: Review of Systems  Constitutional: Negative for chills and fever.  Eyes: Negative for blurred vision.  Respiratory: Negative for cough and shortness of breath.   Cardiovascular: Negative for chest pain.  Gastrointestinal: Negative for abdominal pain, constipation, diarrhea, nausea and vomiting.  Genitourinary: Negative for dysuria.  Musculoskeletal: Positive for joint pain.  Neurological: Negative for dizziness and headaches.   Exam: Physical Exam  Constitutional: She is oriented to person, place, and time.  HENT:  Nose: No mucosal edema.  Mouth/Throat: No oropharyngeal exudate or posterior oropharyngeal edema.  Eyes: Pupils are equal, round, and reactive to light. Conjunctivae, EOM and lids are normal.  Neck: Carotid bruit is not present.  Cardiovascular: S1 normal and S2 normal. Exam reveals no gallop.  No murmur heard. Respiratory: No respiratory distress. She has no wheezes. She has no rhonchi. She has no rales.  GI: Soft. Bowel sounds are normal. There is no abdominal tenderness.  Musculoskeletal:     Right ankle: Swelling present.     Left ankle: Swelling present.  Lymphadenopathy:    She has no cervical adenopathy.  Neurological: She  is alert and oriented to person, place, and time. No cranial nerve deficit.  Skin: Skin is warm. Nails show no clubbing.  Chronic lower extremity discoloration. Stage III decubitus sacrum Right heel decubiti with necrotic material on it Left thigh healing blister  Psychiatric: She has a normal mood and affect.            Data Reviewed: Basic Metabolic Panel: Recent Labs  Lab 08/05/19 0504 08/05/19 1449 08/06/19 0401 08/07/19 0346 08/08/19 0500  NA 134* 133* 137 136 135  K 3.9 2.8* 3.8 2.9* 4.0  CL 109 109 111 105 107  CO2 11* 12* 15* 22 20*  GLUCOSE 435* 192* 166* 99 184*  BUN 93* 87* 77* 71* 65*  CREATININE 2.75* 2.78* 2.66* 2.36* 1.97*  CALCIUM 8.9 8.8* 9.0 8.0* 7.9*  MG  --  1.2*  --   --   --   PHOS  --   --  2.5  --   --    Liver Function Tests: Recent Labs  Lab 08/04/19 1613 08/05/19 0504 08/06/19 0401  AST 93* 47*  --   ALT 90* 67*  --   ALKPHOS 183* 159*  --   BILITOT 0.6 0.5  --   PROT 7.7 7.1  --   ALBUMIN 2.2* 2.0* 2.0*   CBC: Recent Labs  Lab 08/04/19 1613 08/05/19 0504 08/06/19 0401 08/07/19 0346 08/08/19 0500  WBC 19.9* 18.0* 18.1* 13.4* 18.6*  NEUTROABS 13.2*  --   --   --   --   HGB 11.2* 9.7*  10.5* 9.0* 8.5*  HCT 35.8* 31.6* 34.2* 27.7* 26.5*  MCV 90.4 91.3 91.2 87.4 88.6  PLT 478* 439* 525* 454* 488*    CBG: Recent Labs  Lab 08/07/19 1430 08/07/19 1621 08/07/19 2056 08/08/19 0853 08/08/19 1137  GLUCAP 120* 125* 258* 201* 330*    Recent Results (from the past 240 hour(s))  Urine culture     Status: Abnormal   Collection Time: 08/04/19  9:34 PM   Specimen: Urine, Random  Result Value Ref Range Status   Specimen Description   Final    URINE, RANDOM Performed at [facility], [address], [place], [place] [address]    Special Requests   Final    NONE Performed at [facility], [address] [place], [place] [address]    Culture >=100,000 COLONIES/mL [facility] MORGANII (A)  Final   Report  Status 08/08/2019 FINAL  Final   Organism ID, Bacteria MORGANELLA MORGANII (A)  Final      Susceptibility   Morganella morganii - MIC*    AMPICILLIN >=32 RESISTANT Resistant     CEFAZOLIN >=64 RESISTANT Resistant     CIPROFLOXACIN 1 SENSITIVE Sensitive     GENTAMICIN <=1 SENSITIVE Sensitive     IMIPENEM 4 SENSITIVE Sensitive     NITROFURANTOIN 128 RESISTANT Resistant     TRIMETH/SULFA <=20 SENSITIVE Sensitive     AMPICILLIN/SULBACTAM >=32 RESISTANT Resistant     PIP/TAZO <=4 SENSITIVE Sensitive     * >=100,000 COLONIES/mL MORGANELLA MORGANII  C difficile quick scan w PCR reflex     Status: None   Collection Time: 08/06/19 10:10 AM   Specimen: STOOL  Result Value Ref Range Status   C Diff antigen NEGATIVE NEGATIVE Final   C Diff toxin NEGATIVE NEGATIVE Final   C Diff interpretation No C. difficile detected.  Final    Comment: Performed at [facility], [address] [place], [place] [address]     Studies: PERIPHERAL VASCULAR CATHETERIZATION  Result Date: 08/07/2019 See op note  US ARTERIAL SEG MULTIPLE LE (ABI, SEGMENTAL PRESSURES, PVR'S)  Result Date: 08/07/2019 CLINICAL DATA:  Nonhealing wound on the right heel. EXAM: NONINVASIVE PHYSIOLOGIC VASCULAR STUDY OF BILATERAL LOWER EXTREMITIES TECHNIQUE: Evaluation of both lower extremities were performed at rest, including calculation of ankle-brachial indices with single level Doppler, pressure and pulse volume recording. COMPARISON:  None. FINDINGS: Right ABI:  Could not be calculated due to incompressible vessels Right TBI: 0.32 Left ABI:  Could not be calculated due to incompressible vessels Left TBI: 0.28 Right Lower Extremity: Triphasic and biphasic waveforms in the right ankle. Left Lower Extremity: Triphasic and biphasic waveforms in left ankle. IMPRESSION: Ankle-brachial indices could not be calculated due to incompressible vessels. However, relatively normal Doppler waveforms in both ankles. Decreased TBIs bilaterally.  Electronically Signed   By: Markus Daft M.D.   On: 08/07/2019 15:04   DG Foot 2 Views Right  Result Date: 08/06/2019 CLINICAL DATA:  74 year old female with decubitus ulcer right heel. EXAM: RIGHT FOOT - 2 VIEW COMPARISON:  None. FINDINGS: Calcified peripheral vascular disease. Soft tissue swelling in the region of the calcaneus but no soft tissue gas identified. The calcaneus appears intact no cortical osteolysis. Elsewhere bone mineralization in the right foot is also within normal limits. No acute osseous abnormality identified. IMPRESSION: No plain radiographic evidence of osteomyelitis. Right heel soft tissue swelling, calcified peripheral vascular disease. Electronically Signed   By: Genevie Ann M.D.   On: 08/06/2019 19:25   CT RENAL  STONE STUDY  Result Date: 08/07/2019 CLINICAL DATA:  74 year old presenting with hematuria. Personal history of urinary tract calculi. Current history of stage 4 chronic kidney disease. EXAM: CT ABDOMEN AND PELVIS WITHOUT CONTRAST TECHNIQUE: Multidetector CT imaging of the abdomen and pelvis was performed following the standard protocol without IV contrast. COMPARISON:  None. FINDINGS: Lower chest: Minimal linear scar or atelectasis in the visualized lower lobes. Visualized lung bases otherwise clear. Heart size upper normal. LEFT circumflex coronary artery atherosclerosis. Hepatobiliary: Calcified granuloma in the anterior segment RIGHT lobe of liver. Otherwise normal unenhanced appearance of the liver. Multiple gallstones within the gallbladder, the largest on the order of 6 mm in size. No pericholecystic edema or inflammation. No biliary ductal dilation. Pancreas: Mildly atrophic without evidence of mass or peripancreatic inflammation. Spleen: Normal unenhanced appearance. Two accessory splenules medial to the spleen just below the hilum. Adrenals/Urinary Tract: Mild BILATERAL adrenal hyperplasia without discrete nodularity. Moderate RIGHT hydronephrosis, though the dilated  ureter can be followed to the bladder without a visible obstructing stone or mass. Layering hyperdense material in the RIGHT renal pelvis. No LEFT ureteral calculi. Extrarenal pelvis involving the LEFT kidney and/or mild hydronephrosis. Massively distended urinary bladder which likely accounts for the hydronephrosis. Mildly thickened bladder wall diffusely without a visible mass. Stomach/Bowel: Normal-appearing decompressed stomach. Normal-appearing small bowel. Apparent prior low AP resection as there is anastomotic suture material low in the pelvis. Ostomy in the RIGHT mid abdomen with a large parastomal hernia, the defect measuring approximately 7 cm, with colon and small bowel in the hernia sac. Entire remaining colon decompressed. Vascular/Lymphatic: Severe aortoiliofemoral and visceral artery atherosclerosis without evidence of aneurysm. Stent in the LEFT common iliac vein. No pathologic lymphadenopathy. Reproductive: Surgically absent uterus and ovaries. Other: Post surgical/post treatment changes accounting for presacral soft tissue density. Decubitus ulcer overlying the lower sacrum. Musculoskeletal: Sarcopenia. Osseous demineralization. Degenerative disc disease and spondylosis at every lumbar level. BILATERAL L5 pars defects with grade 1 spondylolisthesis of L5 on S1 measuring approximately 7 mm. Likely old sacral insufficiency fracture. No evidence of osteomyelitis involving the sacrum underlying the decubitus ulcer. IMPRESSION: 1. Massively distended urinary bladder which likely accounts for the moderate RIGHT hydronephrosis and the possible mild LEFT hydronephrosis. No obstructing urinary tract calculi. 2. Layering hyperdense material in the RIGHT renal pelvis, possibly hemorrhage or tiny calculi. 3. Decubitus ulcer overlying the lower sacrum without evidence of osteomyelitis involving the sacrum. 4. Cholelithiasis without evidence of acute cholecystitis. 5. 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 No evidence of bowel obstruction. 6. BILATERAL L5 pars defects with grade 1 spondylolisthesis of L5 on S1. Aortic Atherosclerosis (ICD10-I70.0). Electronically Signed   By: Evangeline Dakin M.D.   On: 08/07/2019 12:07    Scheduled Meds: . amLODipine  2.5 mg Oral Daily  . ascorbic acid  500 mg Oral BID  . atorvastatin  80 mg Oral Daily  . Chlorhexidine Gluconate Cloth  6 each Topical Daily  . cholecalciferol  5,000 Units Oral Daily  . collagenase  1 application Topical BID  . feeding supplement (PRO-STAT SUGAR FREE 64)  30 mL Oral TID WC  . gabapentin  300 mg Oral TID  . insulin aspart  0-5 Units Subcutaneous QHS  . insulin aspart  0-6 Units Subcutaneous TID WC  . metoprolol succinate  25 mg Oral Daily  . multivitamin with minerals  1 tablet Oral Daily  . pantoprazole  40 mg Oral Daily  . saccharomyces  boulardii  250 mg Oral BID  . sodium bicarbonate  650 mg Oral BID  . sodium chloride flush  10-40 mL Intracatheter Q12H  . venlafaxine XR  75 mg Oral Daily   Continuous Infusions: . ciprofloxacin 400 mg (08/08/19 1245)    Assessment/Plan:  1. Hematuria, acute urinary retention.  CT scan renal stone protocol shows possible stone or blood in the right renal pelvis.  Severely dilated bladder and right hydronephrosis. Foley catheter placed 08/07/2019.  Holding blood thinner at this time.  Urology would like to keep catheter in for 2 weeks and voiding trial in the office. 2. Acute blood loss anemia.  Continue to follow hemoglobin. Holding IV fluids so I do not keep diluting her down.  We will give IV iron today. 3. Left lower extremity DVT status post procedure last hospitalization by vascular surgery.  IVC filter placed on 08/07/2019. 4. Stage III sacral decubiti, right heel decubiti with necrotic material, left thigh blister.  All present on admission.  Please see pictures.  Wound care and podiatry consultations appreciated. 5. Acute  cystitis with hematuria.  Leukocytosis.  Morganella growing out a urine culture.  Antibiotics changed to Cipro secondary to sensitivities of the urine culture. 6. Essential hypertension on metoprolol and Norvasc 7. Type 2 diabetes mellitus with chronic kidney disease stage IV.  Sliding scale insulin and restart long-acting insulin 20 units at night. 8. Recent Covid infection diagnosed 07/02/2019.  Code Status:     Code Status Orders  (From admission, onward)         Start     Ordered   08/05/19 0254  Full code  Continuous     08/05/19 0254        Code Status History    Date Active Date Inactive Code Status Order ID Comments User Context   07/14/2019 1735 07/22/2019 2203 Full Code [id number]  Louellen Molder, MD ED   Advance Care Planning Activity     Family Communication: Spoke with son on the phone Disposition Plan: Transitional care team looking into rehab options.  Consultants:  Gynecology  Vascular surgery  Podiatry  Urology  Antibiotics:  Cipro  Time spent: 27 minutes  Earlene Bjelland [employer]

## 2019-08-09 LAB — CBC
HCT: 25.4 % — ABNORMAL LOW (ref 36.0–46.0)
Hemoglobin: 8 g/dL — ABNORMAL LOW (ref 12.0–15.0)
MCH: 28.7 pg (ref 26.0–34.0)
MCHC: 31.5 g/dL (ref 30.0–36.0)
MCV: 91 fL (ref 80.0–100.0)
Platelets: 469 10*3/uL — ABNORMAL HIGH (ref 150–400)
RBC: 2.79 MIL/uL — ABNORMAL LOW (ref 3.87–5.11)
RDW: 15.6 % — ABNORMAL HIGH (ref 11.5–15.5)
WBC: 20.1 10*3/uL — ABNORMAL HIGH (ref 4.0–10.5)
nRBC: 0 % (ref 0.0–0.2)

## 2019-08-09 LAB — BASIC METABOLIC PANEL
Anion gap: 8 (ref 5–15)
BUN: 66 mg/dL — ABNORMAL HIGH (ref 8–23)
CO2: 20 mmol/L — ABNORMAL LOW (ref 22–32)
Calcium: 7.9 mg/dL — ABNORMAL LOW (ref 8.9–10.3)
Chloride: 103 mmol/L (ref 98–111)
Creatinine, Ser: 2.05 mg/dL — ABNORMAL HIGH (ref 0.44–1.00)
GFR calc Af Amer: 27 mL/min — ABNORMAL LOW (ref >60)
GFR calc non Af Amer: 24 mL/min — ABNORMAL LOW (ref >60)
Glucose, Bld: 288 mg/dL — ABNORMAL HIGH (ref 70–99)
Potassium: 4.6 mmol/L (ref 3.5–5.1)
Sodium: 131 mmol/L — ABNORMAL LOW (ref 135–145)

## 2019-08-09 LAB — GLUCOSE, CAPILLARY
Glucose-Capillary: 259 mg/dL — ABNORMAL HIGH (ref 70–99)
Glucose-Capillary: 401 mg/dL — ABNORMAL HIGH (ref 70–99)
Glucose-Capillary: 386 mg/dL — ABNORMAL HIGH (ref 70–99)
Glucose-Capillary: 317 mg/dL — ABNORMAL HIGH (ref 70–99)
Glucose-Capillary: 351 mg/dL — ABNORMAL HIGH (ref 70–99)

## 2019-08-09 MED ORDER — SODIUM CHLORIDE 0.9 % IV SOLN
300.0000 mg | Freq: Once | INTRAVENOUS | Status: AC
  Administered 2019-08-10: 05:00:00 300 mg via INTRAVENOUS
  Filled 2019-08-09: qty 15

## 2019-08-09 MED ORDER — INSULIN GLARGINE 100 UNIT/ML ~~LOC~~ SOLN
26.0000 [IU] | Freq: Every day | SUBCUTANEOUS | Status: DC
  Administered 2019-08-09: 23:00:00 26 [IU] via SUBCUTANEOUS
  Filled 2019-08-09 (×2): qty 0.26

## 2019-08-09 MED ORDER — INSULIN ASPART 100 UNIT/ML ~~LOC~~ SOLN
4.0000 [IU] | Freq: Three times a day (TID) | SUBCUTANEOUS | Status: DC
  Administered 2019-08-09 – 2019-08-11 (×6): 4 [IU] via SUBCUTANEOUS
  Filled 2019-08-09 (×6): qty 1

## 2019-08-09 MED ORDER — INSULIN ASPART 100 UNIT/ML ~~LOC~~ SOLN: 4.0000 [IU] | Freq: Three times a day (TID) | SUBCUTANEOUS | Status: DC

## 2019-08-09 NOTE — Evaluation (Signed)
Occupational Therapy Re-Evaluation Patient Details Name: Anne George MRN: [id number] DOB: 18-Apr-1946 Today's Date: 08/09/2019    History of Present Illness Anne George is a 75yoF who comes to [facility] on 2/15 from [facility] ([facility]) d/t weakness and vaginal bleeding. Per [facility] Pt was seen by ourr services prior admission, went to [facility] d/t significant deconditioning. Prior to Jan, pt lives alone at home, AMB with [facility]. PMH: CAD, HTN, HLD, DM1, DVT on eliquis, anal CA s/p radiation s/p colostomy, uncomplicated [id number] infection Jan 2021. Pt underwent stent placement on 2/18. Also seen by wound care for sacral wound and Rt unstageable heel ulcer. Obstetrics following reports bleeding to be likely from urological source.     Clinical Impression  Pt was seen for re-evaluation today due to having a stent placed on 2/18 and IVC filter placed in LLE on 2/19.  Pt is 74 year old female who was at [place] receiving rehab and continues to need MAX A with bed mobility and static sitting balance and MAX assist for LB dressing skills.  Pt educated in proper breathing tech today and given additional exercises to work on increasing strength in [place] to help with functional mobility.  She was living at home alone prior to admission to the hospital and then to [place] and then back to [facility].  Pt would benefit from skilled OT to address noted impairments and functional limitations (see below for any additional details) in order to maximize safety and independence while minimizing fall risk and caregiver burden.  Pt states that her son recently pinched a nerve in his neck or shoulder and won't be able to help as much as before.  Rec STR after discharge to assist with return to PLOF and patient declines going back to [facility].                 Follow Up Recommendations  SNF    Equipment Recommendations  3 in 1 bedside commode    Recommendations for Other Services       Precautions / Restrictions  Precautions Precautions: Fall Precaution Comments: colostomy; float heels Restrictions Weight Bearing Restrictions: No      Mobility Bed Mobility                  Transfers                      Balance                                           ADL either performed or assessed with clinical judgement   ADL Overall ADL's : Needs assistance/impaired                                       General ADL Comments: Pt currently requires MAX A for all LB ADLs, requires MIN A at bed level with HOB elevated with self feeding to cut up food d/t hand arthristis. MAX A for bed mobility, MAX A for static sitting balance at EOB, unable to participate in seated ADLs at this time as all efforts allotted for sitting balance.     Vision Patient Visual Report: No change from baseline       Perception     Praxis      Pertinent  Vitals/Pain Pain Assessment: No/denies pain     Hand Dominance Right   Extremity/Trunk Assessment Upper Extremity Assessment Upper Extremity Assessment: Generalized weakness   Lower Extremity Assessment Lower Extremity Assessment: Defer to PT evaluation   Cervical / Trunk Assessment Cervical / Trunk Assessment: Kyphotic   Communication Communication Communication: No difficulties   Cognition Arousal/Alertness: Awake/alert Behavior During Therapy: WFL for tasks assessed/performed Overall Cognitive Status: Within Functional Limits for tasks assessed                                 General Comments: pt A&O x4, very oriented to medications, timeline of hospitalizations, very good source of PLOF information.   General Comments       Exercises     Shoulder Instructions      Home Living Family/patient expects to be discharged to:: Skilled nursing facility Living Arrangements: Other (Comment) Available Help at Discharge: Family;Available PRN/intermittently Type of Home: Mobile home Home  Access: Stairs to enter Entrance Stairs-Number of Steps: 4-5 Entrance Stairs-Rails: Right;Left;Can reach both       Bathroom Shower/Tub: Occupational psychologist: Standard     Home Equipment: Cane - single point;Shower seat;Walker - 4 wheels;Bedside commode   Additional Comments: Pt reports that she was primarily using [place] for fxl mobility and did not use BSC or shower chair at baseline.      Prior Functioning/Environment          Comments: States she was performing all ADLs/ADL mobility I'ly. Her Son and dtr in law helped with IADLs including transportation and heavy lifting tasks like laundry. However, since recent hospitalization and stay at [facility], endorses decline in mobility/tolerance.        OT Problem List: Decreased strength;Decreased activity tolerance;Impaired balance (sitting and/or standing);Decreased knowledge of use of DME or AE;Pain      OT Treatment/Interventions: Self-care/ADL training;Therapeutic exercise;DME and/or AE instruction;Energy conservation;Patient/family education;Balance training;Therapeutic activities    OT Goals(Current goals can be found in the care plan section) Acute Rehab OT Goals Patient Stated Goal: be able to take care of myself again OT Goal Formulation: With patient Time For Goal Achievement: 08/20/19 Potential to Achieve Goals: Good  OT Frequency: Min 2X/week   Barriers to D/C:            Co-evaluation              AM-PAC OT "6 Clicks" Daily Activity     Outcome Measure Help from another person eating meals?: A Little Help from another person taking care of personal grooming?: A Little Help from another person toileting, which includes using toliet, bedpan, or urinal?: A Lot Help from another person bathing (including washing, rinsing, drying)?: A Lot Help from another person to put on and taking off regular upper body clothing?: A Lot Help from another person to put on and taking off regular lower body  clothing?: A Lot 6 Click Score: 14   End of Session Nurse Communication: Mobility status  Activity Tolerance: Patient tolerated treatment well Patient left: in bed;with call bell/phone within reach;with bed alarm set  OT Visit Diagnosis: Other abnormalities of gait and mobility (R26.89);Muscle weakness (generalized) (M62.81)                Time: [id number] OT Time Calculation (min): 30 min Charges:  OT General Charges $OT Visit: 1 Visit OT Evaluation $OT Re-eval: 1 Re-eval OT Treatments $Self Care/Home Management : 8-22 mins  Chrys Racer, OTR/L, NTMTC ascom [phone] 08/09/19, 2:05 PM

## 2019-08-09 NOTE — Progress Notes (Signed)
Pt with glucose 424; STAT lab draw resulted 468; Rufina Falco, NP aware. Orders received for 10 units lispro coverage, administered as ordered. Pt asymptomatic, denies s/s of hyperglycemia.

## 2019-08-09 NOTE — Progress Notes (Signed)
Patient ID: Anne George, female   DOB: [birth year], 74 y.o.   MRN: [id number] [facility] PROGRESS NOTE  Anne George [id number] DOB: 10/31/1945 DOA: 08/04/2019 PCP: Joseph Pierini, MD  HPI/Subjective: Patient still very weak.  Not as much pain in her legs as previous.  No shortness of breath.  No abdominal pain.  Yesterday was barely able to stand up with physical therapy  Objective: Vitals:   08/08/19 2325 08/09/19 0750  BP: (!) 112/42 (!) 120/44  Pulse: 68 66  Resp: 18 16  Temp: 98.9 F (37.2 C) 98.1 F (36.7 C)  SpO2: 98% 98%    Intake/Output Summary (Last 24 hours) at 08/09/2019 1245 Last data filed at 08/09/2019 1235 Gross per 24 hour  Intake 710 ml  Output 2800 ml  Net -2090 ml   Filed Weights   08/04/19 1600 08/07/19 1421  Weight: 72.6 kg 72.6 kg    ROS: Review of Systems  Constitutional: Negative for chills and fever.  Eyes: Negative for blurred vision.  Respiratory: Negative for cough and shortness of breath.   Cardiovascular: Negative for chest pain.  Gastrointestinal: Negative for abdominal pain, constipation, diarrhea, nausea and vomiting.  Genitourinary: Negative for dysuria.  Musculoskeletal: Positive for joint pain.  Neurological: Negative for dizziness and headaches.   Exam: Physical Exam  Constitutional: She is oriented to person, place, and time.  HENT:  Nose: No mucosal edema.  Mouth/Throat: No oropharyngeal exudate or posterior oropharyngeal edema.  Eyes: Pupils are equal, round, and reactive to light. Conjunctivae, EOM and lids are normal.  Neck: Carotid bruit is not present.  Cardiovascular: S1 normal and S2 normal. Exam reveals no gallop.  No murmur heard. Respiratory: No respiratory distress. She has no wheezes. She has no rhonchi. She has no rales.  GI: Soft. Bowel sounds are normal. There is no abdominal tenderness.  Musculoskeletal:     Right ankle: Swelling present.     Left ankle: Swelling present.   Lymphadenopathy:    She has no cervical adenopathy.  Neurological: She is alert and oriented to person, place, and time. No cranial nerve deficit.  Skin: Skin is warm. Nails show no clubbing.  Chronic lower extremity discoloration. Stage III decubitus sacrum Right heel decubiti with necrotic material on it Left thigh healing blister  Psychiatric: She has a normal mood and affect.            Data Reviewed: Basic Metabolic Panel: Recent Labs  Lab 08/05/19 1449 08/05/19 1449 08/06/19 0401 08/07/19 0346 08/08/19 0500 08/08/19 2058 08/09/19 0340  NA 133*  --  137 136 135  --  131*  K 2.8*  --  3.8 2.9* 4.0  --  4.6  CL 109  --  111 105 107  --  103  CO2 12*  --  15* 22 20*  --  20*  GLUCOSE 192*   < > 166* 99 184* 468* 288*  BUN 87*  --  77* 71* 65*  --  66*  CREATININE 2.78*  --  2.66* 2.36* 1.97*  --  2.05*  CALCIUM 8.8*  --  9.0 8.0* 7.9*  --  7.9*  MG 1.2*  --   --   --   --   --   --   PHOS  --   --  2.5  --   --   --   --    < > = values in this interval not displayed.   Liver Function Tests: Recent Labs  Lab 08/04/19 1613 08/05/19 0504 08/06/19 0401  AST 93* 47*  --   ALT 90* 67*  --   ALKPHOS 183* 159*  --   BILITOT 0.6 0.5  --   PROT 7.7 7.1  --   ALBUMIN 2.2* 2.0* 2.0*   CBC: Recent Labs  Lab 08/04/19 1613 08/04/19 1613 08/05/19 0504 08/06/19 0401 08/07/19 0346 08/08/19 0500 08/09/19 0340  WBC 19.9*   < > 18.0* 18.1* 13.4* 18.6* 20.1*  NEUTROABS 13.2*  --   --   --   --   --   --   HGB 11.2*   < > 9.7* 10.5* 9.0* 8.5* 8.0*  HCT 35.8*   < > 31.6* 34.2* 27.7* 26.5* 25.4*  MCV 90.4   < > 91.3 91.2 87.4 88.6 91.0  PLT 478*   < > 439* 525* 454* 488* 469*   < > = values in this interval not displayed.    CBG: Recent Labs  Lab 08/08/19 1137 08/08/19 1651 08/08/19 2056 08/09/19 0747 08/09/19 1200  GLUCAP 330* 369* 424* 259* 401*    Recent Results (from the past 240 hour(s))  Urine culture     Status: Abnormal   Collection Time:  08/04/19  9:34 PM   Specimen: Urine, Random  Result Value Ref Range Status   Specimen Description   Final    URINE, RANDOM Performed at [facility], [address] [place], [place] [address]    Special Requests   Final    NONE Performed at [facility], [address] [place], [place] [address]    Culture >=100,000 COLONIES/mL [facility] MORGANII (A)  Final   Report Status 08/08/2019 FINAL  Final   Organism ID, Bacteria MORGANELLA MORGANII (A)  Final      Susceptibility   Morganella morganii - MIC*    AMPICILLIN >=32 RESISTANT Resistant     CEFAZOLIN >=64 RESISTANT Resistant     CIPROFLOXACIN 1 SENSITIVE Sensitive     GENTAMICIN <=1 SENSITIVE Sensitive     IMIPENEM 4 SENSITIVE Sensitive     NITROFURANTOIN 128 RESISTANT Resistant     TRIMETH/SULFA <=20 SENSITIVE Sensitive     AMPICILLIN/SULBACTAM >=32 RESISTANT Resistant     PIP/TAZO <=4 SENSITIVE Sensitive     * >=100,000 COLONIES/mL MORGANELLA MORGANII  C difficile quick scan w PCR reflex     Status: None   Collection Time: 08/06/19 10:10 AM   Specimen: STOOL  Result Value Ref Range Status   C Diff antigen NEGATIVE NEGATIVE Final   C Diff toxin NEGATIVE NEGATIVE Final   C Diff interpretation No C. difficile detected.  Final    Comment: Performed at [facility], [address] [place], [place] [address]     Studies: PERIPHERAL VASCULAR CATHETERIZATION  Result Date: 08/07/2019 See op note   Scheduled Meds: . amLODipine  2.5 mg Oral Daily  . ascorbic acid  500 mg Oral BID  . atorvastatin  80 mg Oral Daily  . Chlorhexidine Gluconate Cloth  6 each Topical Daily  . cholecalciferol  5,000 Units Oral Daily  . collagenase  1 application Topical BID  . feeding supplement (PRO-STAT SUGAR FREE 64)  30 mL Oral TID WC  . gabapentin  300 mg Oral TID  . insulin aspart  0-5 Units Subcutaneous QHS  . insulin aspart  0-6 Units Subcutaneous TID WC  . insulin aspart  4 Units Subcutaneous TID WC  .  insulin glargine  26 Units Subcutaneous QHS  . metoprolol  succinate  25 mg Oral Daily  . multivitamin with minerals  1 tablet Oral Daily  . pantoprazole  40 mg Oral Daily  . saccharomyces boulardii  250 mg Oral BID  . sodium bicarbonate  650 mg Oral BID  . sodium chloride flush  10-40 mL Intracatheter Q12H  . venlafaxine XR  75 mg Oral Daily   Continuous Infusions: . ciprofloxacin Stopped (08/09/19 1026)    Assessment/Plan:  1. Hematuria, acute urinary retention.  CT scan renal stone protocol shows possible stone or blood in the right renal pelvis.  Severely dilated bladder and right hydronephrosis. Foley catheter placed 08/07/2019.  Holding blood thinner at this time.  Urology would like to keep catheter in for 2 weeks and voiding trial in the office. 2. Acute blood loss anemia.  Hemoglobin drifted down to 8.0 today.  Check hemoglobin again tomorrow.  I gave IV iron yesterday.  Continue to watch blood counts.  Hesitant on restarting blood thinner right at this point. 3. Left lower extremity DVT status post procedure last hospitalization by vascular surgery.  IVC filter placed on 08/07/2019. 4. Stage III sacral decubiti, right heel decubiti with necrotic material, left thigh blister.  All present on admission.  Please see pictures.  Wound care and podiatry consultations appreciated. 5. Acute cystitis with hematuria.  Leukocytosis.  Morganella growing out a urine culture.  Antibiotics changed on 08/08/2019 to Cipro secondary to sensitivities of the urine culture. 6. Essential hypertension on metoprolol and Norvasc 7. Type 2 diabetes mellitus with chronic kidney disease stage IV.  Increase long-acting insulin to 26 units at night.  Add short acting insulin prior to meals plus sliding scale. 8. Recent Covid infection diagnosed 07/02/2019.  Code Status:     Code Status Orders  (From admission, onward)         Start     Ordered   08/05/19 0254  Full code  Continuous     08/05/19 0254         Code Status History    Date Active Date Inactive Code Status Order ID Comments User Context   07/14/2019 1735 07/22/2019 2203 Full Code [id number]  Louellen Molder, MD ED   Advance Care Planning Activity     Family Communication: Spoke with son on the phone Disposition Plan: Transitional care team working on a rehab bed and [place].  If hemoglobin remains stable potential discharge out to rehab tomorrow. Will give another dose of iv iron tomorrow am.  Consultants:  Gynecology  Vascular surgery  Podiatry  Urology  Antibiotics:  Cipro  Time spent: 28 minutes  [place]

## 2019-08-10 LAB — BASIC METABOLIC PANEL
Anion gap: 6 (ref 5–15)
BUN: 61 mg/dL — ABNORMAL HIGH (ref 8–23)
CO2: 20 mmol/L — ABNORMAL LOW (ref 22–32)
Calcium: 8.1 mg/dL — ABNORMAL LOW (ref 8.9–10.3)
Chloride: 107 mmol/L (ref 98–111)
Creatinine, Ser: 1.9 mg/dL — ABNORMAL HIGH (ref 0.44–1.00)
GFR calc Af Amer: 30 mL/min — ABNORMAL LOW (ref >60)
GFR calc non Af Amer: 26 mL/min — ABNORMAL LOW (ref >60)
Glucose, Bld: 188 mg/dL — ABNORMAL HIGH (ref 70–99)
Potassium: 3.8 mmol/L (ref 3.5–5.1)
Sodium: 133 mmol/L — ABNORMAL LOW (ref 135–145)

## 2019-08-10 LAB — GLUCOSE, CAPILLARY
Glucose-Capillary: 176 mg/dL — ABNORMAL HIGH (ref 70–99)
Glucose-Capillary: 242 mg/dL — ABNORMAL HIGH (ref 70–99)
Glucose-Capillary: 262 mg/dL — ABNORMAL HIGH (ref 70–99)
Glucose-Capillary: 235 mg/dL — ABNORMAL HIGH (ref 70–99)

## 2019-08-10 LAB — PREPARE RBC (CROSSMATCH)

## 2019-08-10 LAB — HEMOGLOBIN: Hemoglobin: 7.7 g/dL — ABNORMAL LOW (ref 12.0–15.0)

## 2019-08-10 MED ORDER — INSULIN GLARGINE 100 UNIT/ML ~~LOC~~ SOLN
28.0000 [IU] | Freq: Every day | SUBCUTANEOUS | Status: DC
  Administered 2019-08-10: 22:00:00 28 [IU] via SUBCUTANEOUS
  Filled 2019-08-10 (×2): qty 0.28

## 2019-08-10 MED ORDER — FUROSEMIDE 10 MG/ML IJ SOLN
20.0000 mg | Freq: Once | INTRAMUSCULAR | Status: AC
  Administered 2019-08-11: 01:00:00 20 mg via INTRAVENOUS
  Filled 2019-08-10: qty 4

## 2019-08-10 MED ORDER — ACETAMINOPHEN 325 MG PO TABS
650.0000 mg | ORAL_TABLET | Freq: Once | ORAL | Status: AC
  Administered 2019-08-10: 21:00:00 650 mg via ORAL
  Filled 2019-08-10: qty 2

## 2019-08-10 MED ORDER — SODIUM CHLORIDE 0.9% IV SOLUTION: Freq: Once | INTRAVENOUS | Status: AC

## 2019-08-10 NOTE — Progress Notes (Signed)
Patient ID: Anne George, female   DOB: [birth year], 74 y.o.   MRN: [id number] [facility] PROGRESS NOTE  RAYSHELL NIKOLIC [id number] DOB: 1945/08/11 DOA: 08/04/2019 PCP: Joseph Pierini, MD  HPI/Subjective: Patient states she feels better every day.  Still very weak.  Still unable to stand much.  Hemoglobin keeps on drifting down.  Urine in the Foley catheter looks yellow.  Objective: Vitals:   08/09/19 2254 08/10/19 0740  BP: (!) 113/43 (!) 113/42  Pulse: 71 68  Resp: 16 14  Temp: 98.1 F (36.7 C) 98.9 F (37.2 C)  SpO2: 100% 98%    Filed Weights   08/04/19 1600 08/07/19 1421  Weight: 72.6 kg 72.6 kg    ROS: Review of Systems  Constitutional: Positive for malaise/fatigue. Negative for chills and fever.  Eyes: Negative for blurred vision.  Respiratory: Negative for cough and shortness of breath.   Cardiovascular: Negative for chest pain.  Gastrointestinal: Negative for abdominal pain, constipation, diarrhea, nausea and vomiting.  Genitourinary: Negative for dysuria.  Musculoskeletal: Negative for joint pain.  Neurological: Negative for dizziness and headaches.   Exam: Physical Exam  Constitutional: She is oriented to person, place, and time.  HENT:  Nose: No mucosal edema.  Mouth/Throat: No oropharyngeal exudate or posterior oropharyngeal edema.  Eyes: Conjunctivae and lids are normal.  Neck: Carotid bruit is not present.  Cardiovascular: S1 normal and S2 normal. Exam reveals no gallop.  No murmur heard. Pulses:      Dorsalis pedis pulses are 2+ on the right side and 2+ on the left side.  Respiratory: No respiratory distress. She has no wheezes. She has no rhonchi. She has no rales.  GI: Soft. Bowel sounds are normal. There is no abdominal tenderness.  Musculoskeletal:     Right shoulder: No swelling.  Lymphadenopathy:    She has no cervical adenopathy.  Neurological: She is alert and oriented to person, place, and time. No cranial nerve deficit.   Skin: Skin is warm. Nails show no clubbing.  Stage III decubitus ulcer sacrum Unstageable right heel ulcer with dry black material Left thigh with black material Chronic lower extremity skin discoloration  Psychiatric: She has a normal mood and affect.      Data Reviewed: Basic Metabolic Panel: Recent Labs  Lab 08/05/19 1449 08/05/19 1449 08/06/19 0401 08/06/19 0401 08/07/19 0346 08/08/19 0500 08/08/19 2058 08/09/19 0340 08/10/19 0755  NA 133*   < > 137  --  136 135  --  131* 133*  K 2.8*   < > 3.8  --  2.9* 4.0  --  4.6 3.8  CL 109   < > 111  --  105 107  --  103 107  CO2 12*   < > 15*  --  22 20*  --  20* 20*  GLUCOSE 192*   < > 166*   < > 99 184* 468* 288* 188*  BUN 87*   < > 77*  --  71* 65*  --  66* 61*  CREATININE 2.78*   < > 2.66*  --  2.36* 1.97*  --  2.05* 1.90*  CALCIUM 8.8*   < > 9.0  --  8.0* 7.9*  --  7.9* 8.1*  MG 1.2*  --   --   --   --   --   --   --   --   PHOS  --   --  2.5  --   --   --   --   --   --    < > =  values in this interval not displayed.   Liver Function Tests: Recent Labs  Lab 08/04/19 1613 08/05/19 0504 08/06/19 0401  AST 93* 47*  --   ALT 90* 67*  --   ALKPHOS 183* 159*  --   BILITOT 0.6 0.5  --   PROT 7.7 7.1  --   ALBUMIN 2.2* 2.0* 2.0*   CBC: Recent Labs  Lab 08/04/19 1613 08/04/19 1613 08/05/19 0504 08/05/19 0504 08/06/19 0401 08/07/19 0346 08/08/19 0500 08/09/19 0340 08/10/19 0755  WBC 19.9*   < > 18.0*  --  18.1* 13.4* 18.6* 20.1*  --   NEUTROABS 13.2*  --   --   --   --   --   --   --   --   HGB 11.2*   < > 9.7*   < > 10.5* 9.0* 8.5* 8.0* 7.7*  HCT 35.8*   < > 31.6*  --  34.2* 27.7* 26.5* 25.4*  --   MCV 90.4   < > 91.3  --  91.2 87.4 88.6 91.0  --   PLT 478*   < > 439*  --  525* 454* 488* 469*  --    < > = values in this interval not displayed.    CBG: Recent Labs  Lab 08/09/19 1628 08/09/19 2112 08/09/19 2306 08/10/19 0757 08/10/19 1149  GLUCAP 386* 317* 351* 176* 242*    Recent Results (from  the past 240 hour(s))  Urine culture     Status: Abnormal   Collection Time: 08/04/19  9:34 PM   Specimen: Urine, Random  Result Value Ref Range Status   Specimen Description   Final    URINE, RANDOM Performed at [facility], [address] [place], [place] [address]    Special Requests   Final    NONE Performed at [facility], [address] [place], [place] [address]    Culture >=100,000 COLONIES/mL [facility] MORGANII (A)  Final   Report Status 08/08/2019 FINAL  Final   Organism ID, Bacteria MORGANELLA MORGANII (A)  Final      Susceptibility   Morganella morganii - MIC*    AMPICILLIN >=32 RESISTANT Resistant     CEFAZOLIN >=64 RESISTANT Resistant     CIPROFLOXACIN 1 SENSITIVE Sensitive     GENTAMICIN <=1 SENSITIVE Sensitive     IMIPENEM 4 SENSITIVE Sensitive     NITROFURANTOIN 128 RESISTANT Resistant     TRIMETH/SULFA <=20 SENSITIVE Sensitive     AMPICILLIN/SULBACTAM >=32 RESISTANT Resistant     PIP/TAZO <=4 SENSITIVE Sensitive     * >=100,000 COLONIES/mL MORGANELLA MORGANII  C difficile quick scan w PCR reflex     Status: None   Collection Time: 08/06/19 10:10 AM   Specimen: STOOL  Result Value Ref Range Status   C Diff antigen NEGATIVE NEGATIVE Final   C Diff toxin NEGATIVE NEGATIVE Final   C Diff interpretation No C. difficile detected.  Final    Comment: Performed at [facility], [address] [place], [place] [address]     Scheduled Meds: . amLODipine  2.5 mg Oral Daily  . ascorbic acid  500 mg Oral BID  . atorvastatin  80 mg Oral Daily  . Chlorhexidine Gluconate Cloth  6 each Topical Daily  . cholecalciferol  5,000 Units Oral Daily  . collagenase  1 application Topical BID  . feeding supplement (PRO-STAT SUGAR FREE 64)  30 mL Oral TID WC  . gabapentin  300 mg Oral TID  .  insulin aspart  0-5 Units Subcutaneous QHS  . insulin aspart  0-6 Units Subcutaneous TID WC  . insulin aspart  4 Units Subcutaneous TID WC  . insulin  glargine  26 Units Subcutaneous QHS  . metoprolol succinate  25 mg Oral Daily  . multivitamin with minerals  1 tablet Oral Daily  . pantoprazole  40 mg Oral Daily  . saccharomyces boulardii  250 mg Oral BID  . sodium bicarbonate  650 mg Oral BID  . sodium chloride flush  10-40 mL Intracatheter Q12H  . venlafaxine XR  75 mg Oral Daily   Continuous Infusions: . ciprofloxacin Stopped (08/10/19 1030)    Assessment/Plan:  1. Acute on chronic blood loss anemia.  Hemoglobin continues to drift down to 7.7 today.  I will transfuse 1 unit of packed red blood cells today.  Patient received IV iron during the hospital course.  Since hemoglobin continues to drift down I am hesitant on restarting blood thinner. 2. Hematuria, acute urinary retention.  CT scan showed severely dilated bladder and right hydronephrosis.  Call Foley catheter placed on 08/07/2019.  Follow-up with Dr. Diamantina [place] as outpatient.  Foley catheter will have to remain in place for 2 weeks and then follow-up with urology for voiding trial. 3. Left lower extremity DVT status post IVC filter on 08/07/2019.  Hesitant on restarting blood thinner right now but may be able to start in a week or 2. 4. Stage III sacral decubitus ulcer, right heel decubiti with black material, left thigh blister with black material.  All present on admission.  Appreciate wound care and podiatry consultations. 5. Acute cystitis with hematuria and leukocytosis.  Morganella growing out a urine culture.  Antibiotics changed on 08/08/2019 to Cipro.  Will need at least 10 to 14 days of treatment.  Will flip over to oral upon discharge to rehab. 6. Essential hypertension on metoprolol and Norvasc 7. Type 2 diabetes mellitus with chronic kidney disease stage IV.  Patient on long-acting insulin and will increase to 28 units nightly.  Short acting insulin prior to meals.  Creatinine down to 1.90. 8. Recent Covid infection diagnosed 07/02/2019  Code Status:     Code Status  Orders  (From admission, onward)         Start     Ordered   08/05/19 0254  Full code  Continuous     08/05/19 0254        Code Status History    Date Active Date Inactive Code Status Order ID Comments User Context   07/14/2019 1735 07/22/2019 2203 Full Code [id number]  Louellen Molder, MD ED   Advance Care Planning Activity     Family Communication: Spoke with son on the phone Disposition Plan: Awaiting insurance authorization for rehab.  Hopefully will get this Monday and out to rehab on Monday  Antibiotics:  Cipro  Time spent: 27 minutes  [place]

## 2019-08-10 NOTE — Plan of Care (Signed)

## 2019-08-10 NOTE — TOC Progression Note (Signed)
Transition of Care [facility]) - Progression Note    Patient Details  Name: Anne George MRN: [id number] Date of Birth: 28-Aug-1945  Transition of Care [facility]) CM/SW Contact  Boris Sharper, LCSW Phone Number:[phone] 08/10/2019, 4:03 PM  Clinical Narrative:    CSW spoke with Janett Billow at [facility] about bed availability and about insurance authorization. CSW completed and faxed insurance auth to [place] [facility] yesterday(2/20) and followed up today(2/21) and it has not been reviewed yet.  TOC will continue to follow for discharge planning needs   Expected Discharge Plan: [place]    Expected Discharge Plan and Services Expected Discharge Plan: [place] arrangements for the past 2 months: [place]                                       Social Determinants of Health (SDOH) Interventions    Readmission Risk Interventions No flowsheet data found.

## 2019-08-11 ENCOUNTER — Encounter: Payer: Medicare PPO | Admitting: Occupational Therapy

## 2019-08-11 LAB — CBC
HCT: 32.2 % — ABNORMAL LOW (ref 36.0–46.0)
Hemoglobin: 10.2 g/dL — ABNORMAL LOW (ref 12.0–15.0)
MCH: 28.8 pg (ref 26.0–34.0)
MCHC: 31.7 g/dL (ref 30.0–36.0)
MCV: 91 fL (ref 80.0–100.0)
Platelets: 418 10*3/uL — ABNORMAL HIGH (ref 150–400)
RBC: 3.54 MIL/uL — ABNORMAL LOW (ref 3.87–5.11)
RDW: 14.8 % (ref 11.5–15.5)
WBC: 15.1 10*3/uL — ABNORMAL HIGH (ref 4.0–10.5)
nRBC: 0 % (ref 0.0–0.2)

## 2019-08-11 LAB — BASIC METABOLIC PANEL
Anion gap: 6 (ref 5–15)
BUN: 59 mg/dL — ABNORMAL HIGH (ref 8–23)
CO2: 21 mmol/L — ABNORMAL LOW (ref 22–32)
Calcium: 8.2 mg/dL — ABNORMAL LOW (ref 8.9–10.3)
Chloride: 109 mmol/L (ref 98–111)
Creatinine, Ser: 1.88 mg/dL — ABNORMAL HIGH (ref 0.44–1.00)
GFR calc Af Amer: 30 mL/min — ABNORMAL LOW (ref >60)
GFR calc non Af Amer: 26 mL/min — ABNORMAL LOW (ref >60)
Glucose, Bld: 188 mg/dL — ABNORMAL HIGH (ref 70–99)
Potassium: 3.3 mmol/L — ABNORMAL LOW (ref 3.5–5.1)
Sodium: 136 mmol/L (ref 135–145)

## 2019-08-11 LAB — GLUCOSE, CAPILLARY: Glucose-Capillary: 239 mg/dL — ABNORMAL HIGH (ref 70–99)

## 2019-08-11 MED ORDER — INSULIN GLARGINE 100 UNIT/ML ~~LOC~~ SOLN
32.0000 [IU] | Freq: Every day | SUBCUTANEOUS | Status: DC
  Filled 2019-08-11: qty 0.32

## 2019-08-11 MED ORDER — INSULIN ASPART 100 UNIT/ML ~~LOC~~ SOLN: 6.0000 [IU] | Freq: Three times a day (TID) | SUBCUTANEOUS | 0 refills | Status: AC

## 2019-08-11 MED ORDER — CIPROFLOXACIN HCL 500 MG PO TABS
500.0000 mg | ORAL_TABLET | Freq: Every day | ORAL | Status: DC
  Administered 2019-08-11: 12:00:00 500 mg via ORAL
  Filled 2019-08-11: qty 1

## 2019-08-11 MED ORDER — POTASSIUM CHLORIDE CRYS ER 20 MEQ PO TBCR
40.0000 meq | EXTENDED_RELEASE_TABLET | Freq: Once | ORAL | Status: AC
  Administered 2019-08-11: 09:00:00 40 meq via ORAL
  Filled 2019-08-11: qty 2

## 2019-08-11 MED ORDER — CIPROFLOXACIN HCL 500 MG PO TABS: 500.0000 mg | ORAL_TABLET | Freq: Every day | ORAL | 0 refills | Status: AC

## 2019-08-11 MED ORDER — SODIUM CHLORIDE 0.9 % IV SOLN
300.0000 mg | Freq: Once | INTRAVENOUS | Status: AC
  Administered 2019-08-11: 10:00:00 300 mg via INTRAVENOUS
  Filled 2019-08-11: qty 15

## 2019-08-11 NOTE — TOC Progression Note (Signed)
Transition of Care [facility]) - Progression Note    Patient Details  Name: ALIYA KLOPFENSTEIN MRN: [id number] Date of Birth: Feb 18, 1946  Transition of Care [facility]) CM/SW Contact  Tamura Lasky, Gardiner Rhyme, LCSW Phone Number: 08/11/2019, 9:41 AM  Clinical Narrative:  Spoke with son this am and pt aware may transfer to [facility] after insurance auth. Both agreeable and MD aware also. Ref number [id number]. Bed available according to [facility]     Expected Discharge Plan: [place]    Expected Discharge Plan and Services Expected Discharge Plan: [place] arrangements for the past 2 months: [place]                                       Social Determinants of Health (SDOH) Interventions    Readmission Risk Interventions No flowsheet data found.

## 2019-08-11 NOTE — TOC Transition Note (Signed)
Transition of Care [facility]) - CM/SW Discharge Note   Patient Details  Name: Anne George MRN: [id number] Date of Birth: 06-05-1946  Transition of Care [facility]) CM/SW Contact:  Elease Hashimoto, LCSW Phone Number: 08/11/2019, 12:25 PM   Clinical Narrative:  Insurance auth received for three days starting 2/21-next update 2/24. Michail [facility]els fax [phone] for continued approval Ref [id number]. Have contacted [place]. Contacted son-David aware of transfer. Bedside RN to call report [phone]. Pt ready to go.  Bedside RN feels 2:00 pm will be ready will call EMS for 2:00 pm.      Final next level of care: [place] Barriers to Discharge: Barriers Resolved   Patient Goals and CMS Choice Patient states their goals for this hospitalization and ongoing recovery are:: Get stronger and discharge to a better SNF      Discharge Placement   Existing PASRR number confirmed : 08/05/19          Patient chooses bed at: [facility] Patient to be transferred to facility by: EMS Name of family member notified: david-son Patient and family notified of of transfer: 08/11/19  Discharge Plan and Services                                     Social Determinants of Health (SDOH) Interventions     Readmission Risk Interventions No flowsheet data found.

## 2019-08-11 NOTE — Plan of Care (Signed)

## 2019-08-11 NOTE — Progress Notes (Signed)
Report called to Evelina Bucy, LPN, at the [facility]. Patient to transport via stretcher with EMS.  No distress.

## 2019-08-11 NOTE — Care Management Important Message (Signed)
Important Message  Patient Details  Name: Anne George MRN: [id number] Date of Birth: 05/08/1946   Medicare Important Message Given:  Yes     [name] 08/11/2019, 11:14 AM

## 2019-08-11 NOTE — Discharge Summary (Signed)
[facility] [place] NAME: Anne George    MR#:  [id number]  DATE OF BIRTH:  Oct 28, 1945  DATE OF ADMISSION:  08/04/2019 ADMITTING PHYSICIAN: Lorella Nimrod, MD  DATE OF DISCHARGE: 08/11/2019  PRIMARY CARE PHYSICIAN: Joseph Pierini, MD    ADMISSION DIAGNOSIS:  Gross hematuria [R31.0] Acute cystitis with hematuria [N30.01] Generalized weakness [R53.1] Acute renal failure superimposed on stage 4 chronic kidney disease, unspecified acute renal failure type ([place]) [N17.9, N18.4]  DISCHARGE DIAGNOSIS:  Principal Problem:   Generalized weakness Active Problems:   Acute deep vein thrombosis (DVT) of left lower extremity (HCC)   Acute renal failure superimposed on stage 4 chronic kidney disease (HCC)   Essential hypertension   CKD stage 4 due to type 2 diabetes mellitus ([place])   Coronary artery disease   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 Diabetes mellitus without complication ([place])     HOSPITAL COURSE:   1.  Acute on chronic blood loss anemia.  The patient's hemoglobin was 11.2 on admission and drifted down to 7.7.  I did give the patient a unit of blood and 2 doses of IV iron.  Patient's hemoglobin up to 10.2 upon discharge.  At first we thought the bleeding was vaginally.  Patient seen by gynecology and they did not see any blood.  We held her blood thinners of Eliquis and aspirin.  The patient did have hematuria.  This has cleared up through the Foley catheter. 2.  Hematuria with acute urinary retention.  CT scan showed severely dilated bladder and right hydronephrosis.  Foley catheter placed on 08/07/2019.  Follow-up with Dr. Diamantina [facility] for  voiding trial as outpatient.  The CT scan also did show an area in the right kidney that could have been the cause of the hematuria. 3.  Left lower extremity DVT.  Patient have an IVC filter placed on 08/07/2019.  I was hesitant on restarting blood thinner right now with the patient's hemoglobin drifting down.  Patient did respond well to blood transfusion.  If hemoglobin remains stable over the next week can consider restarting Eliquis. 4.  Stage III sacral decubitus ulcer, right heel decubiti with black material, left thigh blister with black material.  All present on admission.  Appreciate wound care consultations and podiatry consultations.  Physical therapy must get the patient up and walking again.  Recommend wound care following up at the facility. 5.  Acute cystitis with hematuria and leukocytosis.  Morganella growing out of urine culture.  Antibiotics changed to to Cipro on 08/08/2019.  Will prescribe Cipro orally once a day for another 11 days. 6.  Essential hypertension on metoprolol and Norvasc 7.  Type 2 diabetes mellitus with chronic kidney disease stage IV.  I had to adjust insulin based on her kidney function.  Since her creatinine is now improved down to 1.88 I will go back up to her usual 32 units nightly of long-acting insulin and short acting insulin use prior to meals. 8.  Recent Covid infection diagnosed 07/02/2019.  Patient is not in isolation at this time. 9.  Weakness.  Physical therapy recommends rehab  DISCHARGE CONDITIONS:  satisfactory  CONSULTS OBTAINED:  Treatment Team:  Benjaman Kindler, MD Caroline More, DPM Billey Co, MD  DRUG ALLERGIES:  No Known Allergies  DISCHARGE MEDICATIONS:   Allergies as of 08/11/2019   No Known Allergies     Medication List    STOP taking these medications   apixaban 5 MG Tabs tablet Commonly known as: Eliquis   aspirin 81 MG EC tablet   furosemide 20 MG tablet Commonly known as: LASIX   NovoLOG FlexPen 100 UNIT/ML  FlexPen Generic drug: insulin aspart Replaced by: insulin aspart 100 UNIT/ML injection     TAKE these medications   acetaminophen 650 MG CR tablet Commonly known as: TYLENOL Take 650 mg by mouth every 8 (eight) hours as needed for pain.   amLODipine 2.5 MG tablet Commonly known as: NORVASC Take 2.5 mg by mouth daily.   ascorbic acid 500 MG tablet Commonly known as: VITAMIN C Take 500 mg by mouth 2 (two) times daily.   atorvastatin 80 MG tablet Commonly known as: LIPITOR Take 80 mg by mouth daily.   Cholecalciferol 125 MCG (5000 UT) Tabs Take 5,000 Units by mouth daily.   ciprofloxacin 500 MG tablet Commonly known as: CIPRO Take 1 tablet (500 mg total) by mouth daily for 11 days.   collagenase ointment Commonly known as: SANTYL Apply topically daily. What changed:  how much to take when to take this   Complete Tabs Take 1 tablet by mouth daily.   feeding supplement (PRO-STAT SUGAR FREE 64) Liqd Take 30 mLs by mouth 3 (three) times daily with meals.   gabapentin 100 MG capsule Commonly known as: NEURONTIN Take 300 mg by mouth 3 (three) times daily as needed.   insulin aspart 100 UNIT/ML injection Commonly known as: novoLOG Inject 6 Units into the skin 3 (three) times daily with meals. Replaces: NovoLOG FlexPen 100 UNIT/ML FlexPen   Lantus SoloStar 100 UNIT/ML Solostar Pen Generic drug: Insulin Glargine Inject 32 Units into the skin at bedtime.   methocarbamol 500 MG tablet Commonly known as: ROBAXIN Take 1 tablet (500 mg total) by mouth every 8 (eight) hours as needed for muscle spasms.   metoprolol succinate 25 MG 24 hr tablet Commonly known as: TOPROL-XL Take 25 mg by mouth daily.   omeprazole 20 MG capsule Commonly known as: PRILOSEC Take 20 mg by mouth daily.   saccharomyces boulardii 250 MG capsule Commonly known as: FLORASTOR Take 250 mg by mouth 2 (two) times daily.   sodium bicarbonate 650 MG tablet Take 650 mg by mouth 2 (two) times  daily.   venlafaxine XR 37.5 MG 24 hr capsule Commonly known as: EFFEXOR-XR Take 2 capsules by mouth daily.        DISCHARGE INSTRUCTIONS:   Follow-up with Dr. Ernst Spell in 1 day Follow-up urology 10 days Follow-up vascular surgery in a few weeks  If you experience worsening of your admission symptoms, develop shortness of breath, life threatening emergency, suicidal or homicidal thoughts you must seek medical attention immediately by calling 911 or calling your MD immediately  if symptoms less severe.  You Must read complete instructions/literature along with all the possible adverse reactions/side effects for all the Medicines you take and that have been prescribed to you. Take any new Medicines after you have completely understood and accept all the possible adverse reactions/side effects.   Please note  You were cared for by a hospitalist during your hospital stay. If you have any questions about your discharge medications or the care you  received while you were in the hospital after you are discharged, you can call the unit and asked to speak with the hospitalist on call if the hospitalist that took care of you is not available. Once you are discharged, your primary care physician will handle any further medical issues. Please note that NO REFILLS for any discharge medications will be authorized once you are discharged, as it is imperative that you return to your primary care physician (or establish a relationship with a primary care physician if you do not have one) for your aftercare needs so that they can reassess your need for medications and monitor your lab values.    Today   CHIEF COMPLAINT:   Chief Complaint  Patient presents with  . Vaginal Bleeding    HISTORY OF PRESENT ILLNESS:  Landra Solak  is a 74 y.o. female came in initially with vaginal bleeding   VITAL SIGNS:  Blood pressure (!) 116/39, pulse (!) 58, temperature 97.6 F (36.4 C), temperature source Oral,  resp. rate 15, height 4\' 10"  (1.473 m), weight 72.6 kg, SpO2 97 %.  I/O:    Intake/Output Summary (Last 24 hours) at 08/11/2019 0959 Last data filed at 08/11/2019 0300 Gross per 24 hour  Intake 740 ml  Output 1950 ml  Net -1210 ml    PHYSICAL EXAMINATION:  GENERAL:  73 y.o.-year-old patient lying in the bed with no acute distress.  EYES: Pupils equal, round, reactive to light and accommodation. No scleral icterus. Extraocular muscles intact.  HEENT: Head atraumatic, normocephalic. Oropharynx and nasopharynx clear.  NECK:  Supple, no jugular venous distention. No thyroid enlargement, no tenderness.  LUNGS: Normal breath sounds bilaterally, no wheezing, rales,rhonchi or crepitation. No use of accessory muscles of respiration.  CARDIOVASCULAR: S1, S2 normal. No murmurs, rubs, or gallops.  ABDOMEN: Soft, non-tender, non-distended. Bowel sounds present. No organomegaly or mass.  EXTREMITIES: No pedal edema, cyanosis, or clubbing.  NEUROLOGIC: Cranial nerves II through XII are intact. Muscle strength 5/5 in all extremities. Sensation intact. Gait not checked.  PSYCHIATRIC: The patient is alert and oriented x 3.  SKIN: No obvious rash, lesion, or ulcer.   DATA REVIEW:   CBC Recent Labs  Lab 08/11/19 0641  WBC 15.1*  HGB 10.2*  HCT 32.2*  PLT 418*    Chemistries  Recent Labs  Lab 08/05/19 0504 08/05/19 0504 08/05/19 1449 08/06/19 0401 08/11/19 0641  NA 134*   < > 133*   < > 136  K 3.9   < > 2.8*   < > 3.3*  CL 109   < > 109   < > 109  CO2 11*   < > 12*   < > 21*  GLUCOSE 435*   < > 192*   < > 188*  BUN 93*   < > 87*   < > 59*  CREATININE 2.75*   < > 2.78*   < > 1.88*  CALCIUM 8.9   < > 8.8*   < > 8.2*  MG  --   --  1.2*  --   --   AST 47*  --   --   --   --   ALT 67*  --   --   --   --   ALKPHOS 159*  --   --   --   --   BILITOT 0.5  --   --   --   --    < > = values in this interval not displayed.  Cardiac Enzymes No results for input(s): TROPONINI in the last  168 hours.  Microbiology Results  Results for orders placed or performed during the hospital encounter of 08/04/19  Urine culture     Status: Abnormal   Collection Time: 08/04/19  9:34 PM   Specimen: Urine, Random  Result Value Ref Range Status   Specimen Description   Final    URINE, RANDOM Performed at [facility], [address] [place], [place] [address]    Special Requests   Final    NONE Performed at [facility], [address] [place], [place] [address]    Culture >=100,000 COLONIES/mL [facility] MORGANII (A)  Final   Report Status 08/08/2019 FINAL  Final   Organism ID, Bacteria MORGANELLA MORGANII (A)  Final      Susceptibility   Morganella morganii - MIC*    AMPICILLIN >=32 RESISTANT Resistant     CEFAZOLIN >=64 RESISTANT Resistant     CIPROFLOXACIN 1 SENSITIVE Sensitive     GENTAMICIN <=1 SENSITIVE Sensitive     IMIPENEM 4 SENSITIVE Sensitive     NITROFURANTOIN 128 RESISTANT Resistant     TRIMETH/SULFA <=20 SENSITIVE Sensitive     AMPICILLIN/SULBACTAM >=32 RESISTANT Resistant     PIP/TAZO <=4 SENSITIVE Sensitive     * >=100,000 COLONIES/mL MORGANELLA MORGANII  C difficile quick scan w PCR reflex     Status: None   Collection Time: 08/06/19 10:10 AM   Specimen: STOOL  Result Value Ref Range Status   C Diff antigen NEGATIVE NEGATIVE Final   C Diff toxin NEGATIVE NEGATIVE Final   C Diff interpretation No C. difficile detected.  Final    Comment: Performed at [facility], [address] [place], [place] [address]      Management plans discussed with the patient, family (son yesterday about plan) and they are in agreement.  CODE STATUS:     Code Status Orders  (From admission, onward)         Start     Ordered   08/05/19 0254  Full code  Continuous     08/05/19 0254        Code Status History    Date Active Date Inactive Code Status Order ID Comments User Context   07/14/2019 1735 07/22/2019 2203 Full Code [id number]   Louellen Molder, MD ED   Advance Care Planning Activity      TOTAL TIME TAKING CARE OF THIS PATIENT: 35 minutes.    Loletha Grayer M.D on 08/11/2019 at 9:59 AM  Between 7am to 6pm - Pager - [phone]  After 6pm go to [email or web address] - password [email or web address]  [facility]  CC: Primary care physician; Joseph Pierini, MD

## 2019-08-12 LAB — GLUCOSE, CAPILLARY: Glucose-Capillary: 171 mg/dL — ABNORMAL HIGH (ref 70–99)

## 2019-08-13 ENCOUNTER — Encounter: Payer: Medicare PPO | Admitting: Occupational Therapy

## 2019-08-18 ENCOUNTER — Encounter: Payer: Medicare PPO | Admitting: Occupational Therapy

## 2019-08-20 ENCOUNTER — Encounter: Payer: Medicare PPO | Admitting: Occupational Therapy

## 2019-08-22 LAB — TYPE AND SCREEN
ABO/RH(D): O POS
Antibody Screen: NEGATIVE
Unit division: 0

## 2019-08-22 LAB — BPAM RBC
Blood Product Expiration Date: 202103192359
ISSUE DATE / TIME: 202102212057
Unit Type and Rh: 5100

## 2019-08-27 ENCOUNTER — Ambulatory Visit: Payer: Self-pay | Admitting: Urology

## 2019-08-27 ENCOUNTER — Ambulatory Visit: Payer: Self-pay | Admitting: Physician Assistant

## 2019-09-09 ENCOUNTER — Ambulatory Visit (INDEPENDENT_AMBULATORY_CARE_PROVIDER_SITE_OTHER): Payer: Medicare PPO | Admitting: Vascular Surgery

## 2019-10-15 ENCOUNTER — Other Ambulatory Visit (INDEPENDENT_AMBULATORY_CARE_PROVIDER_SITE_OTHER): Payer: Self-pay | Admitting: Nurse Practitioner

## 2019-10-15 DIAGNOSIS — Z95820 Peripheral vascular angioplasty status with implants and grafts: Secondary | ICD-10-CM

## 2019-10-15 DIAGNOSIS — I82422 Acute embolism and thrombosis of left iliac vein: Secondary | ICD-10-CM

## 2019-10-29 ENCOUNTER — Ambulatory Visit (INDEPENDENT_AMBULATORY_CARE_PROVIDER_SITE_OTHER): Payer: Medicare PPO | Admitting: Nurse Practitioner

## 2019-10-29 ENCOUNTER — Encounter (INDEPENDENT_AMBULATORY_CARE_PROVIDER_SITE_OTHER): Payer: Medicare PPO

## 2019-11-18 DEATH — deceased

## 2021-11-24 IMAGING — DX DG FOOT 2V*R*
2 series · 2 of 2 positions shown · non-contrast
Comparison: None.

CLINICAL DATA: 73-year-old female with decubitus ulcer right heel.

EXAM:
RIGHT FOOT - 2 VIEW

[foot ap]
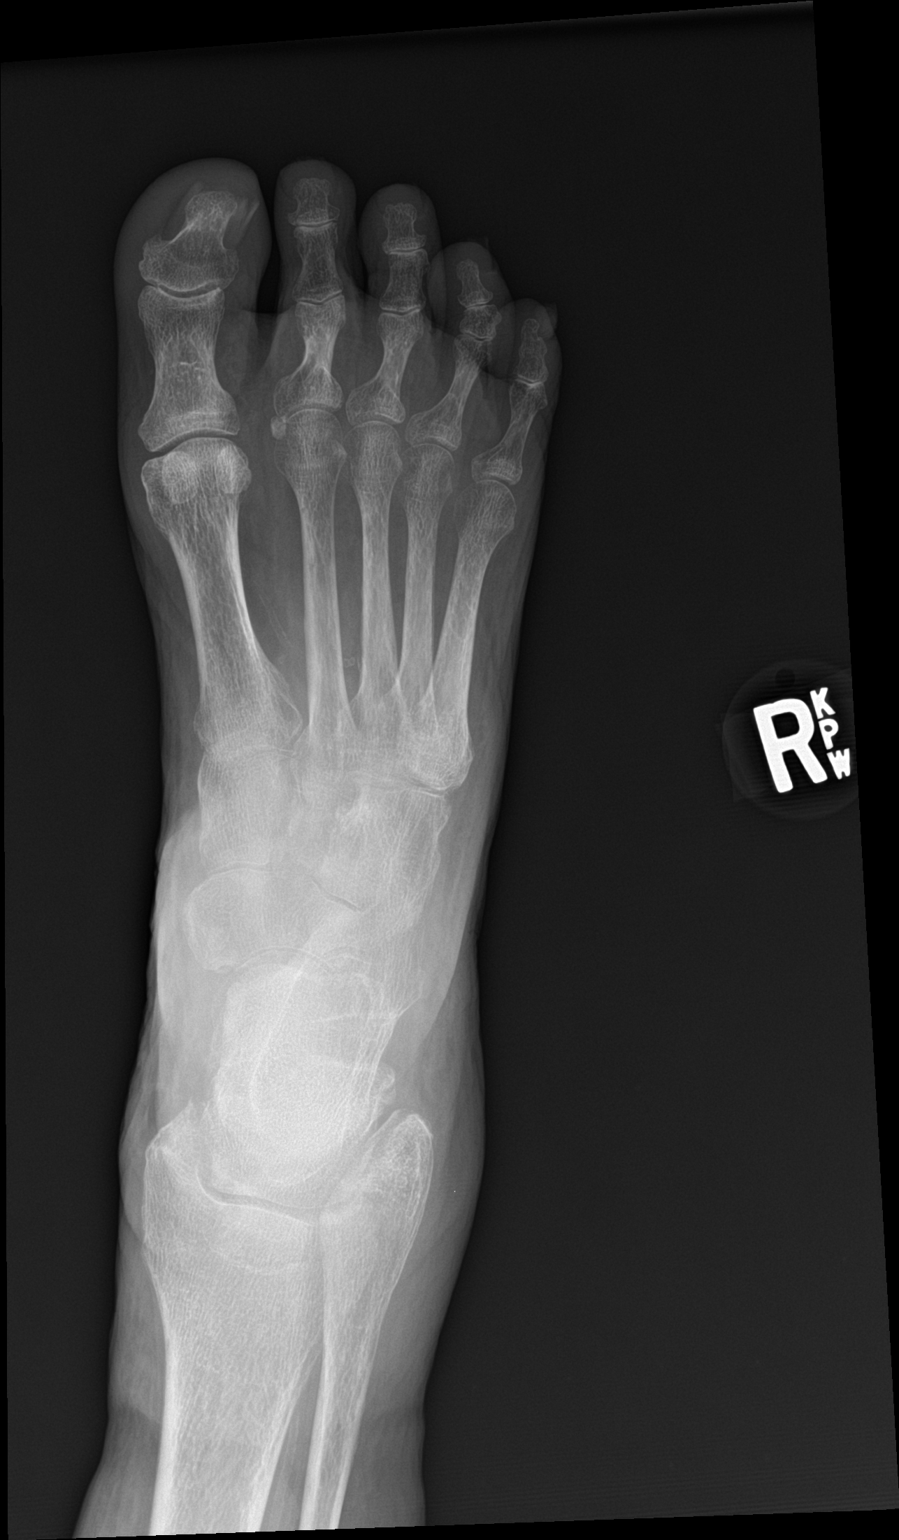

[foot lat]
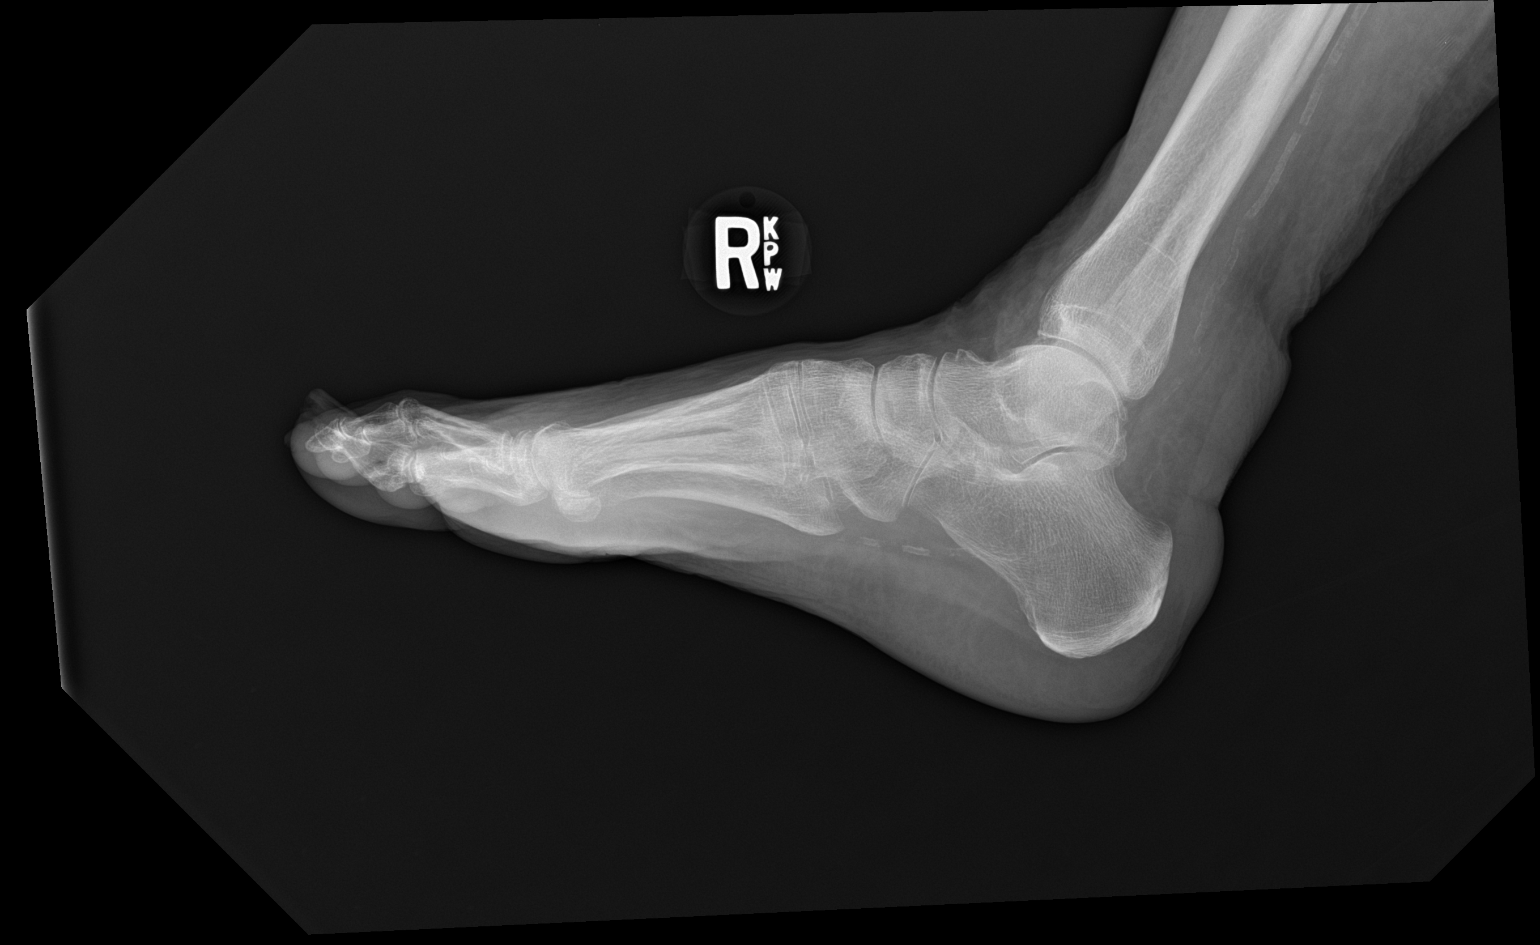

[2 of 2 positions shown; findings below may reference images not displayed]

FINDINGS: Calcified peripheral vascular disease. Soft tissue swelling in the
region of the calcaneus but no soft tissue gas identified. The
calcaneus appears intact no cortical osteolysis. Elsewhere bone
mineralization in the right foot is also within normal limits. No
acute osseous abnormality identified.
IMPRESSION: No plain radiographic evidence of osteomyelitis.

Right heel soft tissue swelling, calcified peripheral vascular
disease.
